# Patient Record
Sex: Female | Born: 1940 | Race: White | Hispanic: No | Smoking: Never smoker
Health system: Southern US, Community
[De-identification: ages and names within clinical notes are randomized; demographics above are authoritative.]

## PROBLEM LIST (undated history)

## (undated) DIAGNOSIS — M8580 Other specified disorders of bone density and structure, unspecified site: Secondary | ICD-10-CM

## (undated) DIAGNOSIS — R7303 Prediabetes: Secondary | ICD-10-CM

## (undated) DIAGNOSIS — E559 Vitamin D deficiency, unspecified: Secondary | ICD-10-CM

## (undated) DIAGNOSIS — K222 Esophageal obstruction: Secondary | ICD-10-CM

## (undated) DIAGNOSIS — T7840XA Allergy, unspecified, initial encounter: Secondary | ICD-10-CM

## (undated) DIAGNOSIS — I4891 Unspecified atrial fibrillation: Secondary | ICD-10-CM

## (undated) DIAGNOSIS — K219 Gastro-esophageal reflux disease without esophagitis: Secondary | ICD-10-CM

## (undated) DIAGNOSIS — I341 Nonrheumatic mitral (valve) prolapse: Secondary | ICD-10-CM

## (undated) HISTORY — DX: Nonrheumatic mitral (valve) prolapse: I34.1

## (undated) HISTORY — PX: TONSILLECTOMY AND ADENOIDECTOMY: SUR1326

## (undated) HISTORY — DX: Gastro-esophageal reflux disease without esophagitis: K21.9

## (undated) HISTORY — DX: Allergy, unspecified, initial encounter: T78.40XA

## (undated) HISTORY — DX: Vitamin D deficiency, unspecified: E55.9

## (undated) HISTORY — PX: ABDOMINAL HYSTERECTOMY: SUR658

## (undated) HISTORY — DX: Prediabetes: R73.03

## (undated) HISTORY — DX: Other specified disorders of bone density and structure, unspecified site: M85.80

## (undated) HISTORY — DX: Esophageal obstruction: K22.2

## (undated) HISTORY — DX: Unspecified atrial fibrillation: I48.91

---

## 1997-09-18 ENCOUNTER — Ambulatory Visit (HOSPITAL_COMMUNITY): Admission: RE | Admit: 1997-09-18 | Discharge: 1997-09-18 | Payer: Self-pay | Admitting: Gastroenterology

## 2006-04-05 ENCOUNTER — Inpatient Hospital Stay (HOSPITAL_COMMUNITY): Admission: AD | Admit: 2006-04-05 | Discharge: 2006-04-07 | Payer: Self-pay | Admitting: Internal Medicine

## 2006-04-06 ENCOUNTER — Encounter (INDEPENDENT_AMBULATORY_CARE_PROVIDER_SITE_OTHER): Payer: Self-pay | Admitting: Interventional Cardiology

## 2006-04-08 ENCOUNTER — Ambulatory Visit (HOSPITAL_COMMUNITY): Admission: RE | Admit: 2006-04-08 | Discharge: 2006-04-08 | Payer: Self-pay | Admitting: Internal Medicine

## 2007-06-05 ENCOUNTER — Ambulatory Visit (HOSPITAL_COMMUNITY): Admission: RE | Admit: 2007-06-05 | Discharge: 2007-06-05 | Payer: Self-pay | Admitting: Interventional Cardiology

## 2007-06-05 ENCOUNTER — Encounter (INDEPENDENT_AMBULATORY_CARE_PROVIDER_SITE_OTHER): Payer: Self-pay | Admitting: Interventional Cardiology

## 2007-10-21 ENCOUNTER — Emergency Department (HOSPITAL_COMMUNITY): Admission: EM | Admit: 2007-10-21 | Discharge: 2007-10-21 | Payer: Self-pay | Admitting: Emergency Medicine

## 2008-11-09 ENCOUNTER — Emergency Department (HOSPITAL_COMMUNITY): Admission: EM | Admit: 2008-11-09 | Discharge: 2008-11-09 | Payer: Self-pay | Admitting: Emergency Medicine

## 2010-04-23 LAB — PROTIME-INR: Prothrombin Time: 31.9 seconds — ABNORMAL HIGH (ref 11.6–15.2)

## 2010-06-02 NOTE — Op Note (Signed)
NAMELOYE, REININGER                ACCOUNT NO.:  [id number]   MEDICAL RECORD NO.:  [id number]          PATIENT TYPE:  AMB   LOCATION:  ENDO                         FACILITY:  [facility]   PHYSICIAN:  Corky Crafts, MDDATE OF BIRTH:  1940/01/23   DATE OF PROCEDURE:  DATE OF DISCHARGE:                               OPERATIVE REPORT   REFERRING:  Emeterio Reeve, MD   PROCEDURES PERFORMED:  DC cardioversion.   PROCEDURE NARRATIVE:  The risks and benefits of DC cardioversion were  explained to the patient and informed consent was obtained.  The  transesophageal echo showed no evidence of left atrial appendage  thrombus.  Defibrillation pads were placed on her anterior chest wall  and back and 120 joules biphasic synchronized shock was administered  with successful restoration of normal sinus rhythm.  Pentothal 125 mg IV  was used.  Dr. Ivin Booty per anesthesia was present.  The patient tolerated  the procedure well.   PLAN:  Continue Coumadin indefinitely.  We will have her take an extra  dose of Coumadin today as her INR was 2.1.  I will have it run a little  bit higher for the next few weeks.  I have her followup in the office in  the next 1-2 weeks.  She will also come in for routine INR checks.      Corky Crafts, MD  Electronically Signed     JSV/MEDQ  D:  06/05/2007  T:  06/06/2007  Job:  [id number]

## 2010-06-05 NOTE — H&P (Signed)
Marie [name], Marie [name]                ACCOUNT NO.:  [id number]   MEDICAL RECORD NO.:  [id number]          PATIENT TYPE:  INP   LOCATION:  4735                         FACILITY:  [facility]   PHYSICIAN:  Andres Shad. Rudean Curt, MD     DATE OF BIRTH:  Aug 19, 1940   DATE OF ADMISSION:  04/05/2006  DATE OF DISCHARGE:                              HISTORY & PHYSICAL   CHIEF COMPLAINT:  New onset atrial fibrillation.   HISTORY OF PRESENT ILLNESS:  Marie [name] is a 70 year old female with a  fairly significant past medical history. She was referred for admission  today because of a new diagnosis of atrial fibrillation with rapid  ventricular response. She was in her usual state of health until  approximately 2-3 weeks ago when she developed what she thought was a  urinary tract infection.  She was seen in the office and found to have a  fast, irregular heart beat at the time.  She was thought not to have a  urinary tract infection but rather a viral syndrome and returned to the  office today for followup and for physical exam.  Today in the office,  she was found to have a heart rate between the low 100s and the 110s,  and a 12-lead EKG revealed atrial fibrillation.   In retrospect, the patient thinks that she may have had episodes of  rapid irregular heart beat numerous times over the last few years.  She  does acknowledge symptoms of palpitations.  In the past she has also had  episodes of passing out several times a year since the mid 1980s.  All  of these episodes have been preceded by a sensation of blacking out for  feeling faint, especially during times of emotional stress.  She does  not recall any episodes preceded by a rapid heart beat or pounding heart  beat.  She states that in 1986 she did have one episode of passing out  that was associated with some weakness on one side of her body.  A CT  scan of the head done at that time was negative.   REVIEW OF SYSTEMS:  Otherwise negative.   MEDICATIONS:  None.   ALLERGIES:  SULFA.   PAST MEDICAL HISTORY:  1. Gastroesophageal reflux.  2  Status post esophageal dilatation.  1. Hysterectomy.   SOCIAL HISTORY:  No smoking, drugs, or alcohol.   FAMILY HISTORY:  Noncontributory.   PHYSICAL EXAMINATION:  VITAL SIGNS: Temperature 97.7, pulse 115,  respiratory rate 18, blood pressure 125/68, oxygen saturation 97% on  room air.  Weight 66 kg.  EKG: Atrial fibrillation with rapid  ventricular response.  GENERAL APPEARANCE:  The patient was alert, well appearing, in no  apparent distress.  HEENT:  Mucous membranes were moist.  Sclerae normal.  NECK:  No jugular venous distention.  LUNGS:  Clear to auscultation bilaterally.  CARDIOVASCULAR:  Irregular, rapid, no murmurs, rubs, or gallops.  ABDOMEN:  Normal bowel sounds.  Soft, nontender, nondistended.  No  organomegaly.  EXTREMITIES:  No evidence of embolic phenomena, no edema.  Warm, well  perfused.  ASSESSMENT AND PLAN:  New diagnosis of atrial fibrillation with rapid  ventricular response.   The first priority will  be to slow down the patient's heart rate . To  this end, I have ordered metoprolol 5 mg IV x1.  If her heart rate fails  to respond to this, I will put her on a longer acting beta blocker to  slow her heart rate.  It is very likely that she will need a chronic  dose of beta blocker to keep her rate controlled.   For further evaluation of her atrial fibrillation, I have ordered a 2-D  echocardiogram to assess wall motion abnormalities or other structural  abnormalities of the heart.  I have also ordered a TSH, a fasting lipid  panel, and a hemoglobin A1c.   Because of her episodes of passing out, I have ordered a CT scan of the  head without contrast that will hopefully show if there is any evidence  of previous thromboembolic disease.   Additionally, I will begin the patient on Coumadin with a goal INR of 2-  3.  The patient will be kept on telemetry  monitoring, and I will order a  set of serial cardiac enzymes to ensure there is no evidence of  myocardial ischemia.      Andres Shad. Rudean Curt, MD  Electronically Signed     PML/MEDQ  D:  04/05/2006  T:  04/05/2006  Job:  [id number]   cc:   Emeterio Reeve, MD

## 2010-10-19 LAB — POCT CARDIAC MARKERS
CKMB, poc: 1.3
CKMB, poc: 2.3
Myoglobin, poc: 115
Myoglobin, poc: 83.2
Troponin i, poc: <0.05
Troponin i, poc: <0.05

## 2010-10-19 LAB — POCT I-STAT, CHEM 8
BUN: 6
Calcium, Ion: 1.17
Chloride: 105
Creatinine, Ser: 0.9
Glucose, Bld: 116 — ABNORMAL HIGH
HCT: 38
Hemoglobin: 12.9
Potassium: 4
Sodium: 141
TCO2: 26

## 2010-10-19 LAB — URINALYSIS, ROUTINE W REFLEX MICROSCOPIC
Nitrite: NEGATIVE
Urobilinogen, UA: 0.2

## 2010-10-19 LAB — PROTIME-INR
INR: 1.7 — ABNORMAL HIGH
Prothrombin Time: 21.1 — ABNORMAL HIGH

## 2010-10-19 LAB — URINE MICROSCOPIC-ADD ON

## 2010-10-19 LAB — APTT: aPTT: 50 — ABNORMAL HIGH

## 2011-04-29 ENCOUNTER — Encounter: Payer: Self-pay | Admitting: Gastroenterology

## 2011-05-20 ENCOUNTER — Ambulatory Visit (INDEPENDENT_AMBULATORY_CARE_PROVIDER_SITE_OTHER): Payer: Medicare Other | Admitting: Gastroenterology

## 2011-05-20 ENCOUNTER — Encounter: Payer: Self-pay | Admitting: Gastroenterology

## 2011-05-20 VITALS — BP 100/70 | HR 58 | Ht 65.0 in | Wt 159.0 lb

## 2011-05-20 DIAGNOSIS — R1310 Dysphagia, unspecified: Secondary | ICD-10-CM

## 2011-05-20 DIAGNOSIS — I4891 Unspecified atrial fibrillation: Secondary | ICD-10-CM

## 2011-05-20 DIAGNOSIS — I7300 Raynaud's syndrome without gangrene: Secondary | ICD-10-CM

## 2011-05-20 DIAGNOSIS — R1314 Dysphagia, pharyngoesophageal phase: Secondary | ICD-10-CM

## 2011-05-20 DIAGNOSIS — K625 Hemorrhage of anus and rectum: Secondary | ICD-10-CM

## 2011-05-20 DIAGNOSIS — K219 Gastro-esophageal reflux disease without esophagitis: Secondary | ICD-10-CM

## 2011-05-20 DIAGNOSIS — D689 Coagulation defect, unspecified: Secondary | ICD-10-CM

## 2011-05-20 MED ORDER — PEG-KCL-NACL-NASULF-NA ASC-C 100 G PO SOLR: 1.0000 | Freq: Once | ORAL | Status: DC

## 2011-05-20 NOTE — Patient Instructions (Addendum)
You have been scheduled for an endoscopy and colonoscopy with propofol. Please follow the written instructions given to you at your visit today. Please pick up your prep at the pharmacy within the next 1-3 days. You will be contaced by our office prior to your procedure for directions on holding your Coumadin/Warfarin.  If you do not hear from our office 1 week prior to your scheduled procedure, please call [phone] to discuss. We have given you samples of the following medication to take: Aciphex every night before bedtime. CC: Dr Mila Palmer, Dr Eldridge Dace

## 2011-05-20 NOTE — Progress Notes (Signed)
History of Present Illness:  This is a very nice 71 year old Caucasian female with a history of atrial fibrillation and possible previous TIAs. She is on Coumadin 5 mg a day and is followed by [facility]. She has Raynaud's phenomenon with a history of chronic acid reflux and previous endoscopic dilation of esophageal strictures. I cannot find her endoscopic exams for review. She currently has thick expectorated phlegm each morning with coughing and possible regurgitation, but denies true reflux. She does have intermittent solid food dysphagia. The patient denies bowel irregularity, melena, but does occasionally have rectal bleeding with straining. She relates that she had colonoscopy over 10 years ago. Appetite is good her weight is stable. She denies any current cardiovascular or pulmonary complaints. Family history is unremarkable in terms of any colon cancer.  I have reviewed this patient's present history, medical and surgical past history, allergies and medications.     ROS: The remainder of the 10 point ROS is negative.. Raynaud's phenomenon but no other symptoms of collagen vascular disease. She is status post hysterectomy. Atrial fibrillation began in year 2008. Currently the patient denies any cardiovascular or neurovascular problems.     Physical Exam: Blood pressure 100/70, pulse 58 and irregular, and weight 159 pounds with BMI of 26.46. General well developed well nourished patient in no acute distress, appearing her stated age Eyes PERRLA, no icterus, fundoscopic exam per opthamologist Skin no lesions noted... very cold digits with visible cyanosis. I cannot appreciate edema or swelling of her joints. Neck supple, no adenopathy, no thyroid enlargement, no tenderness Chest clear to percussion and auscultation Heart no significant murmurs, gallops or rubs noted, irregular rhythm but rate controlled. Abdomen no hepatosplenomegaly masses or tenderness, BS normal.  Rectal inspection  normal no fissures, or fistulae noted. Prominent perianal skin tags noted.  No masses or tenderness on digital exam. Stool guaiac negative. Extremities no acute joint lesions, edema, phlebitis or evidence of cellulitis. Neurologic patient oriented x 3, cranial nerves intact, no focal neurologic deficits noted. Psychological mental status normal and normal affect.  Assessment and plan: 71 year old female who has not had colonoscopy in over 10 years. This has been scheduled at her convenience with holding Coumadin 5 days before this procedure unless otherwise advised by cardiology. 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 Because of her dysphagia, I do think repeat endoscopy also is in order at that time a propofol sedation. The patient relates a recent blood work to show no evidence of anemia or iron deficiency. She appears very stable from a cardiovascular and neurovascular standpoint.   Please copy her primary care physician, referring physician, and pertinent subspecialists. Dr. Mila Palmer and [facility].  Encounter Diagnosis  Name Primary?  . Other and unspecified coagulation defects Yes

## 2011-05-24 ENCOUNTER — Telehealth: Payer: Self-pay | Admitting: *Deleted

## 2011-05-24 NOTE — Telephone Encounter (Signed)
I have advised patient that per Dr Ali Lowe note to our office, she may hold Coumadin 5 days prior to her endoscopy and colonoscopy test with Dr Jarold Motto. Patient verbalizes understanding.

## 2011-06-07 ENCOUNTER — Ambulatory Visit (AMBULATORY_SURGERY_CENTER): Payer: Medicare Other | Admitting: Gastroenterology

## 2011-06-07 ENCOUNTER — Encounter: Payer: Self-pay | Admitting: Gastroenterology

## 2011-06-07 VITALS — BP 120/66 | HR 88 | Temp 98.4°F | Resp 17 | Ht 65.0 in | Wt 159.0 lb

## 2011-06-07 DIAGNOSIS — K219 Gastro-esophageal reflux disease without esophagitis: Secondary | ICD-10-CM

## 2011-06-07 DIAGNOSIS — K5730 Diverticulosis of large intestine without perforation or abscess without bleeding: Secondary | ICD-10-CM

## 2011-06-07 DIAGNOSIS — R1310 Dysphagia, unspecified: Secondary | ICD-10-CM

## 2011-06-07 DIAGNOSIS — R1314 Dysphagia, pharyngoesophageal phase: Secondary | ICD-10-CM

## 2011-06-07 DIAGNOSIS — D689 Coagulation defect, unspecified: Secondary | ICD-10-CM

## 2011-06-07 DIAGNOSIS — Z1211 Encounter for screening for malignant neoplasm of colon: Secondary | ICD-10-CM

## 2011-06-07 MED ORDER — SODIUM CHLORIDE 0.9 % IV SOLN: 500.0000 mL | INTRAVENOUS | Status: DC

## 2011-06-07 NOTE — Progress Notes (Signed)
Propofol per [facility] crna. See scanned intra procedure report. ewm 

## 2011-06-07 NOTE — Op Note (Signed)
[facility] [address] [place], [place]  [address]  COLONOSCOPY PROCEDURE REPORT  PATIENT:  [name], Marie  MR#:  [id number] BIRTHDATE:  1940/04/22, 70 yrs. old  GENDER:  female ENDOSCOPIST:  Vania Rea. Jarold Motto, MD, [facility] REF. BY: PROCEDURE DATE:  06/07/2011 PROCEDURE:  Average-risk screening colonoscopy G0121 ASA CLASS:  Class III INDICATIONS:  Routine Risk Screening MEDICATIONS:   propofol (Diprivan) 150 mg IV  DESCRIPTION OF PROCEDURE:   After the risks and benefits and of the procedure were explained, informed consent was obtained. Digital rectal exam was performed and revealed no abnormalities. The LB CF-[id number] endoscope was introduced through the anus and advanced to the cecum, which was identified by both the appendix and ileocecal valve.  The quality of the prep was excellent, using MoviPrep.  The instrument was then slowly withdrawn as the colon was fully examined. <<PROCEDUREIMAGES>>  FINDINGS:  There were mild diverticular changes in left colon. diverticulosis was found.  No polyps or cancers were seen.  This was otherwise a normal examination of the colon.   Retroflexed views in the rectum revealed no abnormalities.    The scope was then withdrawn from the patient and the procedure completed.  COMPLICATIONS:  None ENDOSCOPIC IMPRESSION: 1) Diverticulosis,mild,left sided diverticulosis 2) No polyps or cancers 3) Otherwise normal examination RECOMMENDATIONS: 1) Repeat Colonscopy in 10 years. 2) High fiber diet  REPEAT EXAM:  No  ______________________________ Vania Rea. Jarold Motto, MD, [facility]  CC:  Mila Palmer, MD  n. Rosalie Doctor:   Vania Rea. Tyren Dugar at 06/07/2011 02:23 PM  Fernande Bras, [phone]

## 2011-06-07 NOTE — Op Note (Signed)
[facility] [address] [place], [place]  [address]  ENDOSCOPY PROCEDURE REPORT  PATIENT:  Marie [name], Marie [name]  MR#:  [id number] BIRTHDATE:  [birth year], 70 yrs. old  GENDER:  female  ENDOSCOPIST:  Vania Rea. Jarold Motto, MD, [facility] Referred by:  PROCEDURE DATE:  06/07/2011 PROCEDURE:  Elease Hashimoto Dilation of Esophagus, EGD, diagnostic 43235 ASA CLASS:  Class III INDICATIONS:  gerd and dysphagia  MEDICATIONS:   There was residual sedation effect present from prior procedure., propofol (Diprivan) 100 mg IV TOPICAL ANESTHETIC:  DESCRIPTION OF PROCEDURE:   After the risks and benefits of the procedure were explained, informed consent was obtained.  The [facility] GIF-H180 [id number] endoscope was introduced through the mouth and advanced to the second portion of the duodenum.  The instrument was slowly withdrawn as the mucosa was fully examined. <<PROCEDUREIMAGES>>  A hiatal hernia was found. large hh noted.  A stricture was found. ESOPHAGEAL STRICTURE DILATED #40F MALONEY DILATOR,NO HEME OR PAIN. There were multiple polyps identified. FUNDAL HYPERPLASTIC POLYPS NOTED.  Normal duodenal folds were noted.    HH AND SOME BLEEDING FROM SCOPE PASSAGE.SEE PICTURES,  The scope was then withdrawn from the patient and the procedure completed.  COMPLICATIONS:  None  ENDOSCOPIC IMPRESSION: 1) Hiatal hernia 2) Stricture 3) Polyps, multiple 4) Normal duodenal folds CHRNIC GERD,ESOPHAGEAL MOTILITY DISORDER,AND ESOHAGEAL STRICTURE DILATED. RECOMMENDATIONS: 1) Clear liquids until, then soft foods rest iof day. Resume prior diet tomorrow. RESUME PPI AND COUMADIN.CHRONIC REFLUX REGIME REVIEWED.  ______________________________ Vania Rea. Jarold Motto, MD, [facility]  CC:  Mila Palmer, MD  n. Rosalie Doctor:   Vania Rea. Acelin Ferdig at 06/07/2011 02:34 PM  Fernande Bras, [id number]

## 2011-06-07 NOTE — Patient Instructions (Signed)
Discharge instructions given with verbal understanding. Handouts on hiatal hernia,stricture,gerd,dilatation diet, and diverticulosis given. Resume previous medications.YOU HAD AN ENDOSCOPIC PROCEDURE TODAY AT THE [facility]: Refer to the procedure report that was given to you for any specific questions about what was found during the examination.  If the procedure report does not answer your questions, please call your gastroenterologist to clarify.  If you requested that your care partner not be given the details of your procedure findings, then the procedure report has been included in a sealed envelope for you to review at your convenience later.  YOU SHOULD EXPECT: Some feelings of bloating in the abdomen. Passage of more gas than usual.  Walking can help get rid of the air that was put into your GI tract during the procedure and reduce the bloating. If you had a lower endoscopy (such as a colonoscopy or flexible sigmoidoscopy) you may notice spotting of blood in your stool or on the toilet paper. If you underwent a bowel prep for your procedure, then you may not have a normal bowel movement for a few days.  DIET: Your first meal following the procedure should be a light meal and then it is ok to progress to your normal diet.  A half-sandwich or bowl of soup is an example of a good first meal.  Heavy or fried foods are harder to digest and may make you feel nauseous or bloated.  Likewise meals heavy in dairy and vegetables can cause extra gas to form and this can also increase the bloating.  Drink plenty of fluids but you should avoid alcoholic beverages for 24 hours.  ACTIVITY: Your care partner should take you home directly after the procedure.  You should plan to take it easy, moving slowly for the rest of the day.  You can resume normal activity the day after the procedure however you should NOT DRIVE or use heavy machinery for 24 hours (because of the sedation medicines used during the  test).    SYMPTOMS TO REPORT IMMEDIATELY: A gastroenterologist can be reached at any hour.  During normal business hours, 8:30 AM to 5:00 PM Monday through Friday, call [phone].  After hours and on weekends, please call the GI answering service at [phone] who will take a message and have the physician on call contact you.   Following lower endoscopy (colonoscopy or flexible sigmoidoscopy):  Excessive amounts of blood in the stool  Significant tenderness or worsening of abdominal pains  Swelling of the abdomen that is new, acute  Fever of 100F or higher  Following upper endoscopy (EGD)  Vomiting of blood or coffee ground material  New chest pain or pain under the shoulder blades  Painful or persistently difficult swallowing  New shortness of breath  Fever of 100F or higher  Black, tarry-looking stools  FOLLOW UP: If any biopsies were taken you will be contacted by phone or by letter within the next 1-3 weeks.  Call your gastroenterologist if you have not heard about the biopsies in 3 weeks.  Our staff will call the home number listed on your records the next business day following your procedure to check on you and address any questions or concerns that you may have at that time regarding the information given to you following your procedure. This is a courtesy call and so if there is no answer at the home number and we have not heard from you through the emergency physician on call, we will assume that  you have returned to your regular daily activities without incident.  SIGNATURES/CONFIDENTIALITY: You and/or your care partner have signed paperwork which will be entered into your electronic medical record.  These signatures attest to the fact that that the information above on your After Visit Summary has been reviewed and is understood.  Full responsibility of the confidentiality of this discharge information lies with you and/or your care-partner.

## 2011-06-07 NOTE — Progress Notes (Signed)
Patient did not experience any of the following events: a burn prior to discharge; a fall within the facility; wrong site/side/patient/procedure/implant event; or a hospital transfer or hospital admission upon discharge from the facility. (G8907) Patient did not have preoperative order for IV antibiotic SSI prophylaxis. (G8918)  

## 2011-06-08 ENCOUNTER — Telehealth: Payer: Self-pay

## 2011-06-08 NOTE — Telephone Encounter (Signed)
  Follow up Call-  Call back number 06/07/2011  Post procedure Call Back phone  # [phone]  Permission to leave phone message Yes     Patient questions:  Do you have a fever, pain , or abdominal swelling? no Pain Score  0 *  Have you tolerated food without any problems? yes  Have you been able to return to your normal activities? yes  Do you have any questions about your discharge instructions: Diet   no Medications  no Follow up visit  no  Do you have questions or concerns about your Care? no  Actions: * If pain score is 4 or above: No action needed, pain <4.

## 2012-12-25 ENCOUNTER — Ambulatory Visit
Admission: RE | Admit: 2012-12-25 | Discharge: 2012-12-25 | Disposition: A | Discharge status: Home / Self Care | Payer: Medicare Other | Source: Ambulatory Visit | Attending: Family Medicine | Admitting: Family Medicine

## 2012-12-25 ENCOUNTER — Other Ambulatory Visit: Payer: Self-pay | Admitting: Family Medicine

## 2012-12-25 DIAGNOSIS — M545 Low back pain: Secondary | ICD-10-CM

## 2012-12-25 DIAGNOSIS — M25551 Pain in right hip: Secondary | ICD-10-CM

## 2013-04-03 ENCOUNTER — Ambulatory Visit: Payer: Medicare Other | Admitting: Interventional Cardiology

## 2013-04-13 ENCOUNTER — Other Ambulatory Visit: Payer: Self-pay | Admitting: Cardiology

## 2013-04-13 MED ORDER — ATENOLOL 25 MG PO TABS: 25.0000 mg | ORAL_TABLET | Freq: Every day | ORAL | Status: DC

## 2013-04-13 NOTE — Telephone Encounter (Signed)
Spoke with pharmacy and he spoke with pt who told him she was only taking atenolol 25 mg qd not bid. Refilled qd since that is what pt has been taking. FYI to Dr. Eldridge DaceVaranasi.

## 2013-04-13 NOTE — Addendum Note (Signed)
Addended byOrlene Plum: Adryen Cookson H on: 04/13/2013 11:56 AM   Modules accepted: Orders

## 2013-04-23 ENCOUNTER — Telehealth: Payer: Self-pay | Admitting: Interventional Cardiology

## 2013-04-23 MED ORDER — ATENOLOL 25 MG PO TABS: ORAL_TABLET | ORAL | Status: DC

## 2013-04-23 NOTE — Telephone Encounter (Signed)
New message     Question about dosage on atenolol.  She takes 2--- 25mg  pills daily but the new presc said take 1 --25mg  pill.

## 2013-04-23 NOTE — Telephone Encounter (Signed)
Spoke with pt and she thinks that she told the pharmacy 1 tablet daily when she was really taking 2 tablets all along. Rx corrected and sent into pharmacy

## 2013-06-12 ENCOUNTER — Ambulatory Visit (INDEPENDENT_AMBULATORY_CARE_PROVIDER_SITE_OTHER): Payer: Medicare Other | Admitting: Interventional Cardiology

## 2013-06-12 ENCOUNTER — Encounter: Payer: Self-pay | Admitting: Cardiology

## 2013-06-12 ENCOUNTER — Encounter: Payer: Self-pay | Admitting: Interventional Cardiology

## 2013-06-12 VITALS — BP 110/90 | HR 81 | Ht 65.0 in | Wt 155.0 lb

## 2013-06-12 DIAGNOSIS — I4891 Unspecified atrial fibrillation: Secondary | ICD-10-CM

## 2013-06-12 DIAGNOSIS — E559 Vitamin D deficiency, unspecified: Secondary | ICD-10-CM | POA: Insufficient documentation

## 2013-06-12 NOTE — Patient Instructions (Signed)
Your physician recommends that you continue on your current medications as directed. Please refer to the Current Medication list given to you today.  Your physician wants you to follow-up in: 1 year with Dr. Varanasi. You will receive a reminder letter in the mail two months in advance. If you don't receive a letter, please call our office to schedule the follow-up appointment.  

## 2013-06-12 NOTE — Progress Notes (Signed)
[name], female   DOB: [birth year], 73 y.o.   MRN: [id number]    [address] [place], [place]  [address] Phone: [phone] Fax:  [phone]  Date:  06/12/2013   ID:  Marie [name], Marie [name] Oct 16, 1940, MRN [id number]  PCP:  Emeterio Reeve, MD      History of Present Illness: Marie [name] is a 73 y.o. female with atrial fibrillation. Feels ok with exercise. Has not been exercising much lately. Atrial Fibrillation F/U:  Denies : Chest pain.  Dizziness.  Leg edema.  Orthopnea.  Palpitations.  Shortness of breath.  Syncope.  She has neck and shoulder pain. She walks some for exercise. She does some exercises at home as well including stretching.    Wt Readings from Last 3 Encounters:  06/12/13 155 lb (70.308 kg)  06/07/11 159 lb (72.122 kg)  05/20/11 159 lb (72.122 kg)     Past Medical History  Diagnosis Date  . Atrial fibrillation   . Esophageal stricture   . Allergy   . GERD (gastroesophageal reflux disease)   . Vitamin D deficiency   . MVP (mitral valve prolapse)   . Pre-diabetes   . Osteopenia     Current Outpatient Prescriptions  Medication Sig Dispense Refill  . atenolol (TENORMIN) 25 MG tablet 2 tablets once a day..  (pt has been on 2 tablets all along).  60 tablet  1  . Multiple Vitamins-Minerals (CENTRUM SILVER PO) Take by mouth.      . warfarin (COUMADIN) 5 MG tablet Take 5 mg by mouth daily.       No current facility-administered medications for this visit.    Allergies:    Allergies  Allergen Reactions  . Adacel [Diphth-Acell Pertussis-Tetanus]   . Sulfa Drugs Cross Reactors   . Tetanus Toxoids   . Vicodin [Hydrocodone-Acetaminophen]     Social History:  The patient  reports that she has never smoked. She has never used smokeless tobacco. She reports that she does not drink alcohol or use illicit drugs.   Family History:  The patient's family history includes Breast cancer in an other family member; Heart  disease in an other family member; Heart failure in her father.   ROS:  Please see the history of present illness.  No nausea, vomiting.  No fevers, chills.  No focal weakness.  No dysuria. Right leg pain.   All other systems reviewed and negative.   PHYSICAL EXAM: VS:  BP 110/90  Pulse 81  Ht 5\' 5"  (1.651 m)  Wt 155 lb (70.308 kg)  BMI 25.79 kg/m2 Well nourished, well developed, in no acute distress HEENT: normal Neck: no JVD, no carotid bruits Cardiac:  normal S1, S2; irregularly irregular Lungs:  clear to auscultation bilaterally, no wheezing, rhonchi or rales Abd: soft, nontender, no hepatomegaly Ext: no edema Skin: warm and dry Neuro:   no focal abnormalities noted  EKG:  AFib, rate controlled     ASSESSMENT AND PLAN:  Atrial fibrillation  Continue Atenolol Tablet, 25, TAKE TWO TABLETS BY MOUTH EVERY DAY Continue Warfarin Sodium Tablet, 5MG , TAKE ONE TABLET BY MOUTH ONCE A DAY IMAGING: EKG    Harward,Amy 04/04/2012 01:50:59 PM > Jilian West,JAY 04/04/2012 02:35:31 PM > AFib, rate controlled. no ST segment changes   Notes: We discussed the newer anticoagulants. Did not like Xarelto for stroke prevention.Rate controlled. She prefers Coumadin, managed by Dr. Paulino Rily.   Preventive Medicine  Adult topics discussed:  Diet: healthy diet.  Exercise: at least 30 minutes of aerobic exercise, 5 days a week.      Signed, Fredric MareJay S. Karas Pickerill, MD, [facility] 06/12/2013 11:17 AM

## 2014-06-13 ENCOUNTER — Ambulatory Visit: Payer: Self-pay | Admitting: Interventional Cardiology

## 2014-06-21 ENCOUNTER — Encounter: Payer: Self-pay | Admitting: Interventional Cardiology

## 2014-06-21 ENCOUNTER — Ambulatory Visit (INDEPENDENT_AMBULATORY_CARE_PROVIDER_SITE_OTHER): Payer: Medicare Other | Admitting: Interventional Cardiology

## 2014-06-21 VITALS — BP 124/70 | HR 69 | Ht 66.0 in | Wt 149.1 lb

## 2014-06-21 DIAGNOSIS — I4891 Unspecified atrial fibrillation: Secondary | ICD-10-CM

## 2014-06-21 DIAGNOSIS — I359 Nonrheumatic aortic valve disorder, unspecified: Secondary | ICD-10-CM

## 2014-06-21 NOTE — Patient Instructions (Signed)
**Note De-identified  Obfuscation** Medication Instructions:  Same-No change  Labwork: None  Testing/Procedures: None  Follow-Up: Your physician wants you to follow-up in: 1 year. You will receive a reminder letter in the mail two months in advance. If you don't receive a letter, please call our office to schedule the follow-up appointment.      

## 2014-06-21 NOTE — Progress Notes (Signed)
[name], female   DOB: Jun 08, 1940, 74 y.o.   MRN: [id number]     Cardiology Office Note   Date:  06/21/2014   ID:  Marie [name] 1940/05/26, MRN [id number]  PCP:  Emeterio Reeve, MD    No chief complaint on file.    Wt Readings from Last 3 Encounters:  06/21/14 149 lb 1.9 oz (67.64 kg)  06/12/13 155 lb (70.308 kg)  06/07/11 159 lb (72.122 kg)       History of Present Illness: Marie [name] is a 74 y.o. female  with atrial fibrillation. Feels ok with exercise, but Has not been exercising much lately. Atrial Fibrillation F/U:  Denies : Chest pain.  Dizziness.  Leg edema.  Orthopnea.  Palpitations.  Shortness of breath.  Syncope.   Sprained her right ankle a few weeks ago.  Wants to start walking with her granddaughter.  No bleeding issues with COumadin.  Dose has been fairly stable.  She does bruise easily.    Past Medical History  Diagnosis Date  . Atrial fibrillation   . Esophageal stricture   . Allergy   . GERD (gastroesophageal reflux disease)   . Vitamin D deficiency   . MVP (mitral valve prolapse)   . Pre-diabetes   . Osteopenia     Past Surgical History  Procedure Laterality Date  . Abdominal hysterectomy      1984  . Tonsillectomy and adenoidectomy       Current Outpatient Prescriptions  Medication Sig Dispense Refill  . atenolol (TENORMIN) 25 MG tablet Take 50 mg by mouth daily.    . cholecalciferol (VITAMIN D) 1000 UNITS tablet Take 1,000 Units by mouth daily.    . Multiple Vitamins-Minerals (CENTRUM SILVER PO) Take 1 tablet by mouth daily.     Marland Kitchen warfarin (COUMADIN) 5 MG tablet Take 5 mg by mouth daily.     No current facility-administered medications for this visit.    Allergies:   Sulfa drugs cross reactors; Adacel; Tetanus toxoids; and Vicodin    Social History:  The patient  reports that she has never smoked. She has never used smokeless tobacco. She reports that she does not drink alcohol or use illicit drugs.     Family History:  The patient's *family history includes Breast cancer in her mother; Healthy in her brother and sister; Heart disease in her maternal aunt; Heart failure in her father; Lymphoma in her mother.    ROS:  Please see the history of present illness.   Otherwise, review of systems are positive for ankle pain- improving.   All other systems are reviewed and negative.    PHYSICAL EXAM: VS:  BP 124/70 mmHg  Pulse 69  Ht  (1.676 m)  Wt 149 lb 1.9 oz (67.64 kg)  BMI 24.08 kg/m2 , BMI Body mass index is 24.08 kg/(m^2). GEN: Well nourished, well developed, in no acute distress HEENT: normal Neck: no JVD, carotid bruits, or masses Cardiac: RRR; 2/6 systolic murmur, rubs, or gallops,no edema  Respiratory:  clear to auscultation bilaterally, normal work of breathing GI: soft, nontender, nondistended, + BS MS: no deformity or atrophy Skin: warm and dry, no rash Neuro:  Strength and sensation are intact Psych: euthymic mood, full affect   EKG:   The ekg ordered today demonstrates AFib, rate controlled   Recent Labs: No results found for requested labs within last 365 days.   Lipid Panel No results found for: CHOL, TRIG, HDL, CHOLHDL, VLDL, LDLCALC,  LDLDIRECT   Other studies Reviewed: Additional studies/ records that were reviewed today with results demonstrating: 2009 echo, mild aortic valve calcification.   ASSESSMENT AND PLAN:  Atrial fibrillation:  Continue Atenolol Tablet, 25, TAKE TWO TABLETS BY MOUTH EVERY DAY Continue Warfarin Sodium Tablet, 5MG , TAKE ONE TABLET BY MOUTH ONCE A DAY       Notes: We discussed the newer anticoagulants. Did not like Xarelto for stroke prevention , she felt it made her legs weak.Rate controlled. She prefers Coumadin, managed by Dr. Paulino RilyWolters.She does not like not being able to eat broccoli, but does not want to try to change anticoagulation.           Aortic calcification:  Murmur is present.  May be mild aortic stenosis.   No sx.  WIll not check echo unless she has symptoms.   Current medicines are reviewed at length with the patient today.  The patient concerns regarding her medicines were addressed.  The following changes have been made:  No change  Labs/ tests ordered today include:  No orders of the defined types were placed in this encounter.    Recommend 150 minutes/week of aerobic exercise Low fat, low carb, high fiber diet recommended  Disposition:   FU in 1 year   Delorise JacksonSigned, Kaysha Parsell S., MD  06/21/2014 11:06 AM    [facility] [address], [place], [place]  [address] Phone: [phone]) [phone]; Fax: [phone]

## 2015-07-04 ENCOUNTER — Ambulatory Visit (INDEPENDENT_AMBULATORY_CARE_PROVIDER_SITE_OTHER): Payer: Medicare Other | Admitting: Interventional Cardiology

## 2015-07-04 ENCOUNTER — Encounter: Payer: Self-pay | Admitting: Interventional Cardiology

## 2015-07-04 VITALS — BP 120/80 | HR 77 | Ht 66.0 in | Wt 154.0 lb

## 2015-07-04 DIAGNOSIS — I359 Nonrheumatic aortic valve disorder, unspecified: Secondary | ICD-10-CM

## 2015-07-04 DIAGNOSIS — I482 Chronic atrial fibrillation, unspecified: Secondary | ICD-10-CM

## 2015-07-04 NOTE — Progress Notes (Signed)
[name], female   DOB: February 13, 1940, 75 y.o.   MRN: [id number]     Cardiology Office Note   Date:  07/04/2015   ID:  Marie [name] 07/31/1940, MRN [id number]  PCP:  Emeterio Reeve, MD    No chief complaint on file.    Wt Readings from Last 3 Encounters:  07/04/15 154 lb (69.854 kg)  06/21/14 149 lb 1.9 oz (67.64 kg)  06/12/13 155 lb (70.308 kg)       History of Present Illness: Marie [name] is a 75 y.o. female  with atrial fibrillation. Feels ok with exercise, but Has not been exercising much lately.  Does not have anybody that she can walk with.  Atrial Fibrillation F/U:  Denies : Chest pain.  Dizziness.  Leg edema.  Orthopnea.  Palpitations.  Shortness of breath.  Syncope.  She does report a persistent bruise on her right foot.  Not walking regularly.  No bleeding issues with COumadin.  Dose has been fairly stable.  She does bruise easily.  Found that her brother and sister, both older, also have AFib.    Past Medical History  Diagnosis Date  . Atrial fibrillation   . Esophageal stricture   . Allergy   . GERD (gastroesophageal reflux disease)   . Vitamin D deficiency   . MVP (mitral valve prolapse)   . Pre-diabetes   . Osteopenia     Past Surgical History  Procedure Laterality Date  . Abdominal hysterectomy      1984  . Tonsillectomy and adenoidectomy       Current Outpatient Prescriptions  Medication Sig Dispense Refill  . atenolol (TENORMIN) 25 MG tablet Take 50 mg by mouth daily.    . cholecalciferol (VITAMIN D) 1000 UNITS tablet Take 1,000 Units by mouth daily.    . Multiple Vitamins-Minerals (CENTRUM SILVER PO) Take 1 tablet by mouth daily.     Marland Kitchen warfarin (COUMADIN) 5 MG tablet Take 5 mg by mouth daily.     No current facility-administered medications for this visit.    Allergies:   Sulfa drugs cross reactors; Adacel; Tetanus toxoids; and Vicodin    Social History:  The patient  reports that she has never smoked.  She has never used smokeless tobacco. She reports that she does not drink alcohol or use illicit drugs.   Family History:  The patient's *family history includes Atrial fibrillation in her brother and sister; Breast cancer in her mother; Healthy in her brother and sister; Heart disease in her maternal aunt; Heart failure in her father; Lymphoma in her mother. There is no history of Heart attack.    ROS:  Please see the history of present illness.   Otherwise, review of systems are positive for ankle pain- improving.   All other systems are reviewed and negative.    PHYSICAL EXAM: VS:  BP 120/80 mmHg  Pulse 77  Ht  (1.676 m)  Wt 154 lb (69.854 kg)  BMI 24.87 kg/m2 , BMI Body mass index is 24.87 kg/(m^2). GEN: Well nourished, well developed, in no acute distress HEENT: normal Neck: no JVD, carotid bruits, or masses Cardiac: irregularly irregular; 2/6 systolic murmur, rubs, or gallops,no edema  Respiratory:  clear to auscultation bilaterally, normal work of breathing GI: soft, nontender, nondistended, + BS MS: no deformity or atrophy Skin: warm and dry, no rash, small area of bruising on the right side of her right heel Neuro:  Strength and sensation are intact Psych: euthymic  mood, full affect   EKG:   The ekg ordered today demonstrates AFib, rate controlled   Recent Labs: No results found for requested labs within last 365 days.   Lipid Panel No results found for: CHOL, TRIG, HDL, CHOLHDL, VLDL, LDLCALC, LDLDIRECT   Other studies Reviewed: Additional studies/ records that were reviewed today with results demonstrating: 2009 echo, mild aortic valve calcification.   ASSESSMENT AND PLAN:  Atrial fibrillation:  Continue Atenolol Tablet, 25, TAKE TWO TABLETS BY MOUTH EVERY DAY Continue Warfarin Sodium Tablet, 5MG , TAKE ONE TABLET BY MOUTH ONCE A DAY       Notes: We discussed the newer anticoagulants. Did not like Xarelto for stroke prevention when she tried it in the  past , she felt it made her legs weak.  She could not walk. Rate controlled.  Explained why she needs 50 mg of atenolol for rate control.  She prefers Coumadin, managed by Dr. Paulino RilyWolters.She does not like not being able to eat broccoli, but does not want to try to change anticoagulation.  She does not want to try Eliquis.         Aortic calcification:  Murmur is present.  May be mild aortic stenosis.  No sx.  WIll not check echo unless she has symptoms.   Current medicines are reviewed at length with the patient today.  The patient concerns regarding her medicines were addressed.  The following changes have been made:  No change  Labs/ tests ordered today include:   Orders Placed This Encounter  Procedures  . EKG 12-Lead    Recommend 150 minutes/week of aerobic exercise Low fat, low carb, high fiber diet recommended  Disposition:   FU in 1 year   Signed, Lance MussJayadeep Audra Kagel, MD  07/04/2015 12:36 PM    [facility] [address], [place], [place]  [address] Phone: [phone](336) [phone]; Fax: [phone]

## 2015-07-04 NOTE — Patient Instructions (Signed)

## 2016-07-05 ENCOUNTER — Ambulatory Visit (INDEPENDENT_AMBULATORY_CARE_PROVIDER_SITE_OTHER): Payer: Medicare Other | Admitting: Podiatry

## 2016-07-05 ENCOUNTER — Ambulatory Visit: Payer: Medicare Other

## 2016-07-05 ENCOUNTER — Ambulatory Visit (INDEPENDENT_AMBULATORY_CARE_PROVIDER_SITE_OTHER): Payer: Medicare Other

## 2016-07-05 DIAGNOSIS — M722 Plantar fascial fibromatosis: Secondary | ICD-10-CM | POA: Diagnosis not present

## 2016-07-05 DIAGNOSIS — R52 Pain, unspecified: Secondary | ICD-10-CM

## 2016-07-05 DIAGNOSIS — M79672 Pain in left foot: Secondary | ICD-10-CM | POA: Diagnosis not present

## 2016-07-05 NOTE — Patient Instructions (Signed)

## 2016-07-08 DIAGNOSIS — M79672 Pain in left foot: Secondary | ICD-10-CM | POA: Insufficient documentation

## 2016-07-08 NOTE — Progress Notes (Signed)
Subjective:    [name], female   DOB: 76 y.o.   MRN: [id number]   HPI 76 year old female presents the office if returns the left foot injury. She states that in 2017 she fell and she heard a pop. She is unsure where she felt this had her foot however. She states that this injury happened after she was seen for some time she stood up in her feet were sleep and she fell forward. She had a surgical shoe at home that she used which did help. She states that she should have recurrence of pain in the bottom of her heel about re-to 4 weeks ago was had no recent treatment since then. She denies any shooting pain and she denies any pain that wakes her up. She has no other concerns.   Review of Systems  All other systems reviewed and are negative.       Objective:  Physical Exam General: AAO x3, NAD  Dermatological: Skin is warm, dry and supple bilateral. Nails x 10 are well manicured; remaining integument appears unremarkable at this time. There are no open sores, no preulcerative lesions, no rash or signs of infection present.  Vascular: Dorsalis Pedis artery and Posterior Tibial artery pedal pulses are 2/4 bilateral with immedate capillary fill time. Pedal hair growth present. . There is no pain with calf compression, swelling, warmth, erythema.   Neruologic: Grossly intact via light touch bilateral. Vibratory intact via tuning fork bilateral. Protective threshold with Semmes Wienstein monofilament intact to all pedal sites bilateral. Negative tinel sing.   Musculoskeletal: On the left foot there is tenderness palpation along the medial band of plantar fascia within the arch of the foot. There is no pain along the insertion the calcaneus there is no area pinpoint bony tenderness or pain the vibratory sensation. The plantar fascia appears to be intact today. There is no overlying edema, erythema, increase in warmth. No other areas of tenderness identified at this time. Muscular  strength 5/5 in all groups tested bilateral.  Gait: Unassisted, Nonantalgic.      Assessment:     76 year old female left arch pain likely result of plantar fasciitis/tendinitis    Plan:  -Treatment options discussed including all alternatives, risks, and complications -Etiology of symptoms were discussed -X-rays were obtained and reviewed with the patient. No evidence of acute fracture identified. -Plantar fascial brace was dispensed -Discussed ice to the area daily. -Stretching exercises were discussed. -Discussed shoe gear medications and orthotics. -I hold off on a steroid injection states the pain was mostly localized the arch. -Follow-up as scheduled or sooner if needed.  Ovid CurdMatthew Harry Shuck, DPM

## 2016-07-15 NOTE — Progress Notes (Signed)
Cardiology Office Note   Date:  07/16/2016   ID:  Tylan, Briguglio 1940-03-20, MRN [id number]  PCP:  Mila Palmer, MD    No chief complaint on file. AFib   Wt Readings from Last 3 Encounters:  07/16/16 155 lb 12.8 oz (70.7 kg)  07/04/15 154 lb (69.9 kg)  06/21/14 149 lb 1.9 oz (67.6 kg)       History of Present Illness: Marie [name] is a 76 y.o. female  With chronic atrial fibrillation.  She has used warfarin for stroke prevention for many years.  She had a recent fall.  No syncope.  She hurt her left leg but there were no broken bones.  She has an ankle brace.  THis was a mechanical fall.  Denies : Chest pain. Dizziness. Leg edema. Nitroglycerin use. Orthopnea. Palpitations. Paroxysmal nocturnal dyspnea. Shortness of breath. Syncope.   INR has been well controled.  She feels better on 4 mg of warfarin compared to 5 mg.  She has stopped eating greens.       Past Medical History:  Diagnosis Date  . Allergy   . Atrial fibrillation   . Esophageal stricture   . GERD (gastroesophageal reflux disease)   . MVP (mitral valve prolapse)   . Osteopenia   . Pre-diabetes   . Vitamin D deficiency     Past Surgical History:  Procedure Laterality Date  . ABDOMINAL HYSTERECTOMY     1984  . TONSILLECTOMY AND ADENOIDECTOMY       Current Outpatient Prescriptions  Medication Sig Dispense Refill  . atenolol (TENORMIN) 25 MG tablet Take 50 mg by mouth daily.    . cholecalciferol (VITAMIN D) 1000 UNITS tablet Take 1,000 Units by mouth daily.    . Multiple Vitamins-Minerals (CENTRUM SILVER PO) Take 1 tablet by mouth daily.     Marland Kitchen warfarin (COUMADIN) 4 MG tablet Take 4 mg by mouth. Monday through Friday    . warfarin (COUMADIN) 5 MG tablet Take 5 mg by mouth. Saturday and Sunday     No current facility-administered medications for this visit.     Allergies:   Sulfa drugs cross reactors; Adacel [diphth-acell pertussis-tetanus]; Tetanus toxoids; and Vicodin  [hydrocodone-acetaminophen]    Social History:  The patient  reports that she has never smoked. She has never used smokeless tobacco. She reports that she does not drink alcohol or use drugs.   Family History:  The patient's family history includes Atrial fibrillation in her brother and sister; Breast cancer in her mother; Healthy in her brother and sister; Heart disease in her maternal aunt; Heart failure in her father; Lymphoma in her mother.    ROS:  Please see the history of present illness.   Otherwise, review of systems are positive for recent fall.   All other systems are reviewed and negative.    PHYSICAL EXAM: VS:  BP 118/68   Pulse 78   Ht 5\' 6"  (1.676 m)   Wt 155 lb 12.8 oz (70.7 kg)   LMP  (LMP Unknown)   BMI 25.15 kg/m  , BMI Body mass index is 25.15 kg/m. GEN: Well nourished, well developed, in no acute distress  HEENT: normal  Neck: no JVD, carotid bruits, or masses Cardiac: irregularly irregular, 2/6 systolic murmur, no rubs, or gallops,no edema  Respiratory:  clear to auscultation bilaterally, normal work of breathing GI: soft, nontender, nondistended, + BS MS: no deformity or atrophy  Skin: warm and dry, no rash Neuro:  Strength and  sensation are intact Psych: euthymic mood, full affect   EKG:   The ekg ordered today demonstrates AFib, rate controlled   Recent Labs: No results found for requested labs within last 8760 hours.   Lipid Panel No results found for: CHOL, TRIG, HDL, CHOLHDL, VLDL, LDLCALC, LDLDIRECT   Other studies Reviewed: Additional studies/ records that were reviewed today with results demonstrating: EF 55-60% in 2008..   ASSESSMENT AND PLAN:  1. Atrial fibrillation : We have discussed DOAC in the past but she prefers warfarin.  Rate controlled on current dose of atenolol. 2. Aortic calcification: She has had a murmur for some time, but no sx of severe AS. 3. *Anticoagulated: Benefits of coumadin outweigh the risks.  COntinue  aggressive stroke prevention.    Current medicines are reviewed at length with the patient today.  The patient concerns regarding her medicines were addressed.  The following changes have been made:  No change  Labs/ tests ordered today include:  No orders of the defined types were placed in this encounter.   Recommend 150 minutes/week of aerobic exercise Low fat, low carb, high fiber diet recommended  Disposition:   FU in 1 year   Signed, Lance MussJayadeep Keenya Matera, MD  07/16/2016 10:12 AM    [facility] [address], [place], [place]  [address] Phone: [phone]([phone]; Fax: [phone]

## 2016-07-16 ENCOUNTER — Ambulatory Visit (INDEPENDENT_AMBULATORY_CARE_PROVIDER_SITE_OTHER): Payer: Medicare Other | Admitting: Interventional Cardiology

## 2016-07-16 ENCOUNTER — Encounter: Payer: Self-pay | Admitting: Interventional Cardiology

## 2016-07-16 VITALS — BP 118/68 | HR 78 | Ht 66.0 in | Wt 155.8 lb

## 2016-07-16 DIAGNOSIS — Z5181 Encounter for therapeutic drug level monitoring: Secondary | ICD-10-CM

## 2016-07-16 DIAGNOSIS — Z7901 Long term (current) use of anticoagulants: Secondary | ICD-10-CM | POA: Diagnosis not present

## 2016-07-16 DIAGNOSIS — I359 Nonrheumatic aortic valve disorder, unspecified: Secondary | ICD-10-CM | POA: Diagnosis not present

## 2016-07-16 DIAGNOSIS — I4891 Unspecified atrial fibrillation: Secondary | ICD-10-CM

## 2016-07-16 NOTE — Patient Instructions (Signed)

## 2016-07-28 ENCOUNTER — Ambulatory Visit: Payer: Medicare Other | Admitting: Podiatry

## 2016-07-29 ENCOUNTER — Ambulatory Visit: Payer: Medicare Other | Admitting: Podiatry

## 2016-12-16 ENCOUNTER — Emergency Department (HOSPITAL_COMMUNITY): Payer: Medicare Other

## 2016-12-16 ENCOUNTER — Encounter (HOSPITAL_COMMUNITY): Payer: Self-pay | Admitting: Emergency Medicine

## 2016-12-16 ENCOUNTER — Emergency Department (HOSPITAL_COMMUNITY)
Admission: EM | Admit: 2016-12-16 | Discharge: 2016-12-16 | Disposition: A | Discharge status: Home / Self Care | Payer: Medicare Other | Attending: Emergency Medicine | Admitting: Emergency Medicine

## 2016-12-16 DIAGNOSIS — Z79899 Other long term (current) drug therapy: Secondary | ICD-10-CM | POA: Diagnosis not present

## 2016-12-16 DIAGNOSIS — W182XXA Fall in (into) shower or empty bathtub, initial encounter: Secondary | ICD-10-CM | POA: Insufficient documentation

## 2016-12-16 DIAGNOSIS — Y939 Activity, unspecified: Secondary | ICD-10-CM | POA: Insufficient documentation

## 2016-12-16 DIAGNOSIS — Y92002 Bathroom of unspecified non-institutional (private) residence single-family (private) house as the place of occurrence of the external cause: Secondary | ICD-10-CM | POA: Diagnosis not present

## 2016-12-16 DIAGNOSIS — Z7901 Long term (current) use of anticoagulants: Secondary | ICD-10-CM | POA: Insufficient documentation

## 2016-12-16 DIAGNOSIS — S92502A Displaced unspecified fracture of left lesser toe(s), initial encounter for closed fracture: Secondary | ICD-10-CM | POA: Insufficient documentation

## 2016-12-16 DIAGNOSIS — R270 Ataxia, unspecified: Secondary | ICD-10-CM | POA: Insufficient documentation

## 2016-12-16 DIAGNOSIS — S92501A Displaced unspecified fracture of right lesser toe(s), initial encounter for closed fracture: Secondary | ICD-10-CM | POA: Insufficient documentation

## 2016-12-16 DIAGNOSIS — W19XXXA Unspecified fall, initial encounter: Secondary | ICD-10-CM

## 2016-12-16 DIAGNOSIS — Y998 Other external cause status: Secondary | ICD-10-CM | POA: Diagnosis not present

## 2016-12-16 NOTE — ED Provider Notes (Signed)
[facility]-EMERGENCY DEPT Provider Note   CSN: [id number] Arrival date & time: 12/16/16  1659     History   Chief Complaint Chief Complaint  Patient presents with  . Fall    HPI Marie [name] is a 76 y.o. female.  The history is provided by the patient and medical records.     76 y.o. F wit hx of AFIB, GERD, vit D deficiency, allergies, presenting to the ED after a fall.  Patient reports yesterday she slipped in the bathtub, hitting her right foot on the right stopper, and left foot against the side of the tub.  She denies any head injury or loss of consciousness.  She has been ambulatory since, but with worsening pain and swelling in her toes.  She does take Coumadin for A. fib, states her INR is usually within normal limits.  She is due to have this checked in 1 week. States she has been wearing postop shoe that she had at home from prior ankle surgery on the left foot, but did not have one for the right foot.  She has been able to wear her tennis shoes without too much pain.  Denies any headache, dizziness, confusion, numbness, weakness.  Past Medical History:  Diagnosis Date  . Allergy   . Atrial fibrillation   . Esophageal stricture   . GERD (gastroesophageal reflux disease)   . MVP (mitral valve prolapse)   . Osteopenia   . Pre-diabetes   . Vitamin D deficiency     Patient Active Problem List   Diagnosis Date Noted  . Anticoagulated on Coumadin 07/16/2016  . Arch pain of left foot 07/08/2016  . Aortic valve calcification 06/21/2014  . Atrial fibrillation (HCC) 06/12/2013  . Vitamin D deficiency     Past Surgical History:  Procedure Laterality Date  . ABDOMINAL HYSTERECTOMY     1984  . TONSILLECTOMY AND ADENOIDECTOMY      OB History    No data available       Home Medications    Prior to Admission medications   Medication Sig Start Date End Date Taking? Authorizing Provider  acetaminophen (TYLENOL) 500 MG tablet Take 500 mg by  mouth every 6 (six) hours as needed for moderate pain.   Yes [provider]  atenolol (TENORMIN) 25 MG tablet Take 50 mg by mouth every evening.    Yes [provider]  warfarin (COUMADIN) 4 MG tablet Take 4 mg by mouth every evening. Monday through Friday    Yes [provider]  cholecalciferol (VITAMIN D) 1000 UNITS tablet Take 1,000 Units by mouth daily.    [provider]  Multiple Vitamins-Minerals (CENTRUM SILVER PO) Take 1 tablet by mouth daily.     [provider]  warfarin (COUMADIN) 5 MG tablet Take 5 mg by mouth. Saturday and Sunday    [provider]    Family History Family History  Problem Relation Age of Onset  . Heart failure Father   . Lymphoma Mother   . Breast cancer Mother   . Healthy Sister   . Healthy Brother   . Heart disease Maternal Aunt   . Atrial fibrillation Sister   . Atrial fibrillation Brother   . Heart attack Neg Hx     Social History Social History   Tobacco Use  . Smoking status: Never Smoker  . Smokeless tobacco: Never Used  Substance Use Topics  . Alcohol use: No  . Drug use: No  Allergies   Sulfa drugs cross reactors; Adacel [diphth-acell pertussis-tetanus]; Tetanus toxoids; and Vicodin [hydrocodone-acetaminophen]   Review of Systems Review of Systems  Musculoskeletal: Positive for arthralgias.  Skin: Positive for wound.  All other systems reviewed and are negative.    Physical Exam Updated Vital Signs BP 139/79 (BP Location: Left Arm)   Pulse 75   Temp 98.3 F (36.8 C) (Oral)   Resp 18   Ht 5\' 6"  (1.676 m)   Wt 70.8 kg (156 lb)   LMP  (LMP Unknown)   SpO2 100%   BMI 25.18 kg/m   Physical Exam  Constitutional: She is oriented to person, place, and time. She appears well-developed and well-nourished.  Patient is up and pacing around room with sneakers on upon entering exam room  HENT:  Head: Normocephalic and atraumatic.  Mouth/Throat: Oropharynx is clear and  moist.  Eyes: Conjunctivae and EOM are normal. Pupils are equal, round, and reactive to light.  Neck: Normal range of motion.  Cardiovascular: Normal rate, regular rhythm and normal heart sounds.  Pulmonary/Chest: Effort normal and breath sounds normal. No stridor. No respiratory distress.  Abdominal: Soft. Bowel sounds are normal.  Musculoskeletal: Normal range of motion.  Right foot with swelling and bruising over the fifth digit and proximal fourth digit, small abrasion noted over distal 4th digit but no signs of infection; no bony deformity noted, moving toes normally Left foot with diffuse swelling and bruising of the second toe; no open wounds or sores; moving toe normally Normal distal sensation and perfusion of all toes; normal sensation throughout; DP pulses intact bilaterally  Neurological: She is alert and oriented to person, place, and time.  AAOx3, answering questions and following commands appropriately; equal strength UE and LE bilaterally; CN grossly intact; moves all extremities appropriately without ataxia; no focal neuro deficits or facial asymmetry appreciated  Skin: Skin is warm and dry.  Psychiatric: She has a normal mood and affect.  Nursing note and vitals reviewed.    ED Treatments / Results  Labs (all labs ordered are listed, but only abnormal results are displayed) Labs Reviewed - No data to display  EKG  EKG Interpretation None       Radiology Ct Head Wo Contrast  Result Date: 12/16/2016 CLINICAL DATA:  Head trauma and ataxia.  Initial encounter. EXAM: CT HEAD WITHOUT CONTRAST TECHNIQUE: Contiguous axial images were obtained from the base of the skull through the vertex without intravenous contrast. COMPARISON:  11/09/2008 FINDINGS: Brain: No evidence of acute infarction, hemorrhage, hydrocephalus, extra-axial collection or mass lesion/mass effect. Vascular: No hyperdense vessel or unexpected calcification. Skull: Normal. Negative for fracture or focal  lesion. Sinuses/Orbits: Negative IMPRESSION: Negative head CT. Electronically Signed   By: Marnee SpringJonathon  Watts M.D.   On: 12/16/2016 19:15   Dg Foot Complete Left  Result Date: 12/16/2016 CLINICAL DATA:  Patient reports fall in shower yesterday. Pt c/o bilateral generalized foot pain. Bruising noted to fourth and fifth toes on right foot and second toe on left foot. EXAM: LEFT FOOT - COMPLETE 3+ VIEW COMPARISON:  None FINDINGS: Minimally displaced fracture involving the distal portion of middle phalanx of the second digit. Osteopenia. IMPRESSION: Middle phalangeal fracture, second digit. Electronically Signed   By: Jeronimo GreavesKyle  Talbot M.D.   On: 12/16/2016 18:53   Dg Foot Complete Right  Result Date: 12/16/2016 CLINICAL DATA:  Larey SeatFell in shower yesterday.  Generalized foot pain. EXAM: RIGHT FOOT COMPLETE - 3+ VIEW COMPARISON:  None. FINDINGS: Three views study shows a transverse fracture  through the neck of the little toe proximal phalanx. No other acute fracture identified. Bones are diffusely demineralized. IMPRESSION: Transverse fracture through the neck of the little toe proximal phalanx. Electronically Signed   By: Kennith Center M.D.   On: 12/16/2016 19:05    Procedures Procedures (including critical care time)  Medications Ordered in ED Medications - No data to display   Initial Impression / Assessment and Plan / ED Course  I have reviewed the triage vital signs and the nursing notes.  Pertinent labs & imaging results that were available during my care of the patient were reviewed by me and considered in my medical decision making (see chart for details).  76 year old female here after a fall yesterday.  Slipped in the shower.  No head injury or loss of consciousness.  She is on Coumadin.  Has pain in both of her feet, mostly the toes.  She does have swelling and bruising to left second toe and right little toe.  No acute deformities noted.  Sensation intact.  Patient ambulatory with steady gait.   Screening x-rays were obtained from triage revealing fractures of the right fifth toe and left second toe.  No apparent complicating features.  CT of the head was also sent given her anticoagulation use and negative.  Patient is awake, alert, appropriately oriented here without noted deficit.  States she has been on Coumadin for years, no real issues keeping it within normal limits.  I discussed the option of checking INR here, however patient does not want to wait any longer.  She wants to go home.  She has an appointment to have her INR checked next week with her doctor.  She has been wearing postop shoe on the left foot but did not have one for her right foot.  We discussed ordering her one and she agreed this would likely be helpful, however decided at time of discharge she did not want to wait for it.  We discussed ice and elevation at home.  Close follow-up with PCP.  Discussed plan with patient, she acknowledged understanding and agreed with plan of care.  Return precautions given for new or worsening symptoms.  Final Clinical Impressions(s) / ED Diagnoses   Final diagnoses:  Fall, initial encounter  Closed fracture of phalanx of right fifth toe, initial encounter  Closed fracture of phalanx of left second toe, initial encounter    ED Discharge Orders    None       Garlon Hatchet, PA-C 12/17/16 0109    Lorre Nick, MD 12/20/16 [phone]

## 2016-12-16 NOTE — ED Triage Notes (Signed)
Patient reports fall in shower yesterday. Denies LOC, neck and back pain. C/o bilateral foot pain. Bruising noted to fourth and fifth toes on right foot and second toe on left foot. Ambulatory. Palpable pulses in bilateral feet.

## 2016-12-16 NOTE — Discharge Instructions (Signed)
You can wear the post op shoes for now. Try to ice and elevate your toes at home to help with swelling. This should gradually improve, try to monitor. Follow-up with your primary care doctor. Return here for any new/worsening symptoms.

## 2016-12-16 NOTE — ED Notes (Signed)
Patient declined post op shoes.

## 2017-01-10 ENCOUNTER — Other Ambulatory Visit: Payer: Self-pay | Admitting: Family Medicine

## 2017-01-10 DIAGNOSIS — R1030 Lower abdominal pain, unspecified: Secondary | ICD-10-CM

## 2017-01-10 DIAGNOSIS — Q6479 Other congenital malformations of bladder and urethra: Secondary | ICD-10-CM

## 2017-01-10 DIAGNOSIS — R319 Hematuria, unspecified: Secondary | ICD-10-CM

## 2017-02-08 ENCOUNTER — Ambulatory Visit
Admission: RE | Admit: 2017-02-08 | Discharge: 2017-02-08 | Disposition: A | Discharge status: Home / Self Care | Payer: Medicare Other | Source: Ambulatory Visit | Attending: Family Medicine | Admitting: Family Medicine

## 2017-02-08 DIAGNOSIS — Q6479 Other congenital malformations of bladder and urethra: Secondary | ICD-10-CM

## 2017-02-08 DIAGNOSIS — R1030 Lower abdominal pain, unspecified: Secondary | ICD-10-CM

## 2017-02-08 DIAGNOSIS — R319 Hematuria, unspecified: Secondary | ICD-10-CM

## 2017-07-17 NOTE — Progress Notes (Signed)
Cardiology Office Note   Date:  07/19/2017   ID:  Marie [name], Marie [name] 1940-11-30, MRN [id number]  PCP:  Marie Palmer, MD    No chief complaint on file.  AFib  Wt Readings from Last 3 Encounters:  07/19/17 154 lb (69.9 kg)  12/16/16 156 lb (70.8 kg)  07/16/16 155 lb 12.8 oz (70.7 kg)       History of Present Illness: Marie [name] is a 77 y.o. female   With chronic atrial fibrillation.  She has used warfarin for stroke prevention for many years.  Had a fall in 2018.  INR has been well controled.  She feels better on 4 mg of warfarin compared to 5 mg.  She has stopped eating greens.    She fell again in 11/18 and broke some toes, slipping out of the bathtub.  Denies : Chest pain. Dizziness. Leg edema. Nitroglycerin use. Orthopnea. Palpitations. Paroxysmal nocturnal dyspnea. Shortness of breath. Syncope.   Coumadin dose has been stable.   Walking for exercise in the house.  She does some chair exercises as well.      Past Medical History:  Diagnosis Date  . Allergy   . Atrial fibrillation   . Esophageal stricture   . GERD (gastroesophageal reflux disease)   . MVP (mitral valve prolapse)   . Osteopenia   . Pre-diabetes   . Vitamin D deficiency     Past Surgical History:  Procedure Laterality Date  . ABDOMINAL HYSTERECTOMY     1984  . TONSILLECTOMY AND ADENOIDECTOMY       Current Outpatient Medications  Medication Sig Dispense Refill  . acetaminophen (TYLENOL) 500 MG tablet Take 500 mg by mouth every 6 (six) hours as needed for moderate pain.    Marland Kitchen atenolol (TENORMIN) 25 MG tablet Take 50 mg by mouth every evening.     . cholecalciferol (VITAMIN D) 1000 UNITS tablet Take 1,000 Units by mouth daily.    . Multiple Vitamins-Minerals (CENTRUM SILVER PO) Take 1 tablet by mouth daily.     Marland Kitchen warfarin (COUMADIN) 4 MG tablet Take 4 mg by mouth every evening.     . warfarin (COUMADIN) 5 MG tablet Take 5 mg by mouth. Saturday and Sunday     No current  facility-administered medications for this visit.     Allergies:   Sulfa drugs cross reactors; Adacel [tetanus-diphth-acell pertussis]; Tetanus toxoids; and Vicodin [hydrocodone-acetaminophen]    Social History:  The patient  reports that she has never smoked. She has never used smokeless tobacco. She reports that she does not drink alcohol or use drugs.   Family History:  The patient's family history includes Atrial fibrillation in her brother and sister; Breast cancer in her mother; Healthy in her brother and sister; Heart disease in her maternal aunt; Heart failure in her father; Lymphoma in her mother.    ROS:  Please see the history of present illness.   Otherwise, review of systems are positive for left leg pain after twisting leg.   All other systems are reviewed and negative.    PHYSICAL EXAM: VS:  BP 130/78   Pulse 77   Ht 5\' 6"  (1.676 m)   Wt 154 lb (69.9 kg)   LMP  (LMP Unknown)   SpO2 98%   BMI 24.86 kg/m  , BMI Body mass index is 24.86 kg/m. GEN: Well nourished, well developed, in no acute distress  HEENT: normal  Neck: no JVD, carotid bruits, or masses Cardiac: irregularly irregular;  2/6 systolic murmur, no rubs, or gallops,no edema  Respiratory:  clear to auscultation bilaterally, normal work of breathing GI: soft, nontender, nondistended, + BS MS: no deformity or atrophy  Skin: warm and dry, no rash Neuro:  Strength and sensation are intact Psych: euthymic mood, full affect   EKG:   The ekg ordered today demonstrates AFib, rate controlled   Recent Labs: No results found for requested labs within last 8760 hours.   Lipid Panel No results found for: CHOL, TRIG, HDL, CHOLHDL, VLDL, LDLCALC, LDLDIRECT   Other studies Reviewed: Additional studies/ records that were reviewed today with results demonstrating: LDL 73 in 6/19.   ASSESSMENT AND PLAN:  1. AFib: rate controlled.  COntinue atenolol.  COntinue current meds.   2. Aortic calcification: No signs of  severe AS.  No near syncope or syncope.  Murmur is mild. 3. Anticoagulated: No bleeding problems.  Benefits of anticoagulation outweigh the risk.  COntinue stroke prevention with Coumadin. Avoid falls.   Current medicines are reviewed at length with the patient today.  The patient concerns regarding her medicines were addressed.  The following changes have been made:  No change  Labs/ tests ordered today include:  No orders of the defined types were placed in this encounter.   Recommend 150 minutes/week of aerobic exercise Low fat, low carb, high fiber diet recommended  Disposition:   FU in 1 year   Signed, Lance MussJayadeep Michaiah Holsopple, MD  07/19/2017 9:54 AM    [facility] [address], [place], [place]  [address] Phone: [phone]; Fax: [phone]

## 2017-07-19 ENCOUNTER — Encounter (INDEPENDENT_AMBULATORY_CARE_PROVIDER_SITE_OTHER): Payer: Self-pay

## 2017-07-19 ENCOUNTER — Encounter: Payer: Self-pay | Admitting: Interventional Cardiology

## 2017-07-19 ENCOUNTER — Ambulatory Visit: Payer: Medicare Other | Admitting: Interventional Cardiology

## 2017-07-19 VITALS — BP 130/78 | HR 77 | Ht 66.0 in | Wt 154.0 lb

## 2017-07-19 DIAGNOSIS — I4819 Other persistent atrial fibrillation: Secondary | ICD-10-CM

## 2017-07-19 DIAGNOSIS — I359 Nonrheumatic aortic valve disorder, unspecified: Secondary | ICD-10-CM

## 2017-07-19 DIAGNOSIS — I481 Persistent atrial fibrillation: Secondary | ICD-10-CM | POA: Diagnosis not present

## 2017-07-19 DIAGNOSIS — Z7901 Long term (current) use of anticoagulants: Secondary | ICD-10-CM | POA: Diagnosis not present

## 2017-07-19 NOTE — Patient Instructions (Signed)

## 2018-07-18 ENCOUNTER — Telehealth: Payer: Self-pay

## 2018-07-18 NOTE — Telephone Encounter (Signed)
Patient will come in to the office for her appt tomorrow with ML. Patient answers no to all screening questions below. Patient understands that there are no visitors allowed. Patient understands not to arrive more than 15 minutes prior to the scheduled appointment time and that a mask will be required for the visit.      COVID-19 Pre-Screening Questions:  . In the past 7 to 10 days have you had a cough,  shortness of breath, headache, congestion, fever (100 or greater) body aches, chills, sore throat, or sudden loss of taste or sense of smell? NO . Have you been around anyone with known Covid 19? NO . Have you been around anyone who is awaiting Covid 19 test results in the past 7 to 10 days? NO . Have you been around anyone who has been exposed to Covid 19, or has mentioned symptoms of Covid 19 within the past 7 to 10 days? NO

## 2018-07-18 NOTE — Progress Notes (Signed)
Cardiology Office Note    Date:  07/19/2018   ID:  Marie, [name] 01-25-1940, MRN [id number]  PCP:  Jonathon Jordan, MD  Cardiologist: Larae Grooms, MD EPS: None  Chief Complaint  Patient presents with  . Follow-up    History of Present Illness:  Marie [name] is a 78 y.o. female with chronic atrial fibrillation on coumadin. Has had some falls, Aortic calcification.  Last saw Dr. Irish Lack 07/2017 and doing well.  Denies chest pain, palpitations, dyspnea, dyspnea on exertion,  or presyncope. No falls in the past year. Says she gets dizzy and off balance when walking around. Not always when she first gets up.Afraid of driving and going to the grocery store.  Thinks she's on too much atenolol.  Past Medical History:  Diagnosis Date  . Allergy   . Atrial fibrillation   . Esophageal stricture   . GERD (gastroesophageal reflux disease)   . MVP (mitral valve prolapse)   . Osteopenia   . Pre-diabetes   . Vitamin D deficiency     Past Surgical History:  Procedure Laterality Date  . ABDOMINAL HYSTERECTOMY     1984  . TONSILLECTOMY AND ADENOIDECTOMY      Current Medications: Current Meds  Medication Sig  . acetaminophen (TYLENOL) 500 MG tablet Take 500 mg by mouth every 6 (six) hours as needed for moderate pain.  Marland Kitchen atenolol (TENORMIN) 25 MG tablet Take 50 mg by mouth every evening.   . cholecalciferol (VITAMIN D) 1000 UNITS tablet Take 1,000 Units by mouth daily.  . Multiple Vitamins-Minerals (CENTRUM SILVER PO) Take 1 tablet by mouth daily.   Marland Kitchen warfarin (COUMADIN) 4 MG tablet Take 4 mg by mouth every evening.      Allergies:   Sulfa drugs cross reactors, Adacel [tetanus-diphth-acell pertussis], Tetanus toxoids, and Vicodin [hydrocodone-acetaminophen]   Social History   Socioeconomic History  . Marital status: Single    Spouse name: Not on file  . Number of children: Not on file  . Years of education: Not on file  . Highest education level: Not on file   Occupational History  . Occupation: retired  Scientific laboratory technician  . Financial resource strain: Not on file  . Food insecurity    Worry: Not on file    Inability: Not on file  . Transportation needs    Medical: Not on file    Non-medical: Not on file  Tobacco Use  . Smoking status: Never Smoker  . Smokeless tobacco: Never Used  Substance and Sexual Activity  . Alcohol use: No  . Drug use: No  . Sexual activity: Not on file  Lifestyle  . Physical activity    Days per week: Not on file    Minutes per session: Not on file  . Stress: Not on file  Relationships  . Social Herbalist on phone: Not on file    Gets together: Not on file    Attends religious service: Not on file    Active member of club or organization: Not on file    Attends meetings of clubs or organizations: Not on file    Relationship status: Not on file  Other Topics Concern  . Not on file  Social History Narrative  . Not on file     Family History:  The patient's   family history includes Atrial fibrillation in her brother and sister; Breast cancer in her mother; Healthy in her brother and sister; Heart disease in her maternal  aunt; Heart failure in her father; Lymphoma in her mother.   ROS:   Please see the history of present illness.    Review of Systems  Constitution: Negative.  HENT: Negative.   Eyes: Negative.   Cardiovascular: Negative.   Respiratory: Negative.   Hematologic/Lymphatic: Negative.   Musculoskeletal: Negative.  Negative for joint pain.  Gastrointestinal: Negative.   Genitourinary: Negative.   Neurological: Negative.    All other systems reviewed and are negative.   PHYSICAL EXAM:   VS:  BP 128/68   Pulse 70   Ht 5\' 6"  (1.676 m)   Wt 154 lb 6.4 oz (70 kg)   LMP  (LMP Unknown)   SpO2 96%   BMI 24.92 kg/m   Physical Exam  GEN: Well nourished, well developed, in no acute distress  Neck: no JVD, carotid bruits, or masses Cardiac:RRR; 2/6 systolic murmur LSB, and apex  Respiratory:  clear to auscultation bilaterally, normal work of breathing GI: soft, nontender, nondistended, + BS Ext: without cyanosis, clubbing, or edema, Good distal pulses bilaterally Neuro:  Alert and Oriented x 3 Psych: euthymic mood, full affect  Wt Readings from Last 3 Encounters:  07/19/18 154 lb 6.4 oz (70 kg)  07/19/17 154 lb (69.9 kg)  12/16/16 156 lb (70.8 kg)      Studies/Labs Reviewed:   EKG:  EKG is ordered today.  The ekg ordered today demonstrates Atrial fibrillation at 74/m  With poor R wave progression-no change from last EKG.   Recent Labs: No results found for requested labs within last 8760 hours.   Lipid Panel No results found for: CHOL, TRIG, HDL, CHOLHDL, VLDL, LDLCALC, LDLDIRECT  Additional studies/ records that were reviewed today include:       ASSESSMENT:    1. Longstanding persistent atrial fibrillation   2. Anticoagulated on Coumadin   3. Aortic valve calcification   4. Dizziness   5. Fall, sequela      PLAN:  In order of problems listed above:   Persistent Afib on coumadin- doing well without tachycardia. Coumadin managed by PCP and having labs drawn 08/10/18  Dizziness and off balance. She thinks it's from the atenolol. I offered to decrease it to 25 mg once daily and send patient BP cuff to monitor BP and HR but she declined.   Aortic valve calcification-no echo since 2009-AS and MR murmurs on exam. Recommend repeat echo but patient declined.   Falls in the past but not in the last year.   Medication Adjustments/Labs and Tests Ordered: Current medicines are reviewed at length with the patient today.  Concerns regarding medicines are outlined above.  Medication changes, Labs and Tests ordered today are listed in the Patient Instructions below. Patient Instructions  Medication Instructions:  Your physician recommends that you continue on your current medications as directed. Please refer to the Current Medication list given to  you today.  If you need a refill on your cardiac medications before your next appointment, please call your pharmacy.   Lab work:  None ordered today  Testing/Procedures:  None ordered today  Follow-Up: At [facility], you and your health needs are our priority.  As part of our continuing mission to provide you with exceptional heart care, we have created designated Provider Care Teams.  These Care Teams include your primary Cardiologist (physician) and Advanced Practice Providers (APPs -  Physician Assistants and Nurse Practitioners) who all work together to provide you with the care you need, when you need it. You will  need a follow up appointment in 12 months.  Please call our office 2 months in advance to schedule this appointment.  You may see Lance MussJayadeep Varanasi, MD or one of the following Advanced Practice Providers on your designated Care Team:   [place] FallsBrittainy Simmons, PA-C Ronie Spiesayna Dunn, PA-C . Jacolyn ReedyMichele Charlotta Lapaglia, PA-C       Signed, Jacolyn ReedyMichele Starr Urias, PA-C  07/19/2018 9:28 AM    [facility] [address], [place], [place]  [address] Phone: [phone]; Fax: [phone]

## 2018-07-19 ENCOUNTER — Encounter (INDEPENDENT_AMBULATORY_CARE_PROVIDER_SITE_OTHER): Payer: Self-pay

## 2018-07-19 ENCOUNTER — Encounter: Payer: Self-pay | Admitting: Physician Assistant

## 2018-07-19 ENCOUNTER — Ambulatory Visit (INDEPENDENT_AMBULATORY_CARE_PROVIDER_SITE_OTHER): Payer: Medicare Other | Admitting: Physician Assistant

## 2018-07-19 ENCOUNTER — Other Ambulatory Visit: Payer: Self-pay

## 2018-07-19 VITALS — BP 128/68 | HR 70 | Ht 66.0 in | Wt 154.4 lb

## 2018-07-19 DIAGNOSIS — I4811 Longstanding persistent atrial fibrillation: Secondary | ICD-10-CM | POA: Diagnosis not present

## 2018-07-19 DIAGNOSIS — I359 Nonrheumatic aortic valve disorder, unspecified: Secondary | ICD-10-CM | POA: Diagnosis not present

## 2018-07-19 DIAGNOSIS — R42 Dizziness and giddiness: Secondary | ICD-10-CM | POA: Diagnosis not present

## 2018-07-19 DIAGNOSIS — Z7901 Long term (current) use of anticoagulants: Secondary | ICD-10-CM | POA: Diagnosis not present

## 2018-07-19 DIAGNOSIS — W19XXXS Unspecified fall, sequela: Secondary | ICD-10-CM

## 2018-07-19 NOTE — Patient Instructions (Signed)
Medication Instructions:  Your physician recommends that you continue on your current medications as directed. Please refer to the Current Medication list given to you today.  If you need a refill on your cardiac medications before your next appointment, please call your pharmacy.   Lab work:  None ordered today  Testing/Procedures:  None ordered today  Follow-Up: At [facility], you and your health needs are our priority.  As part of our continuing mission to provide you with exceptional heart care, we have created designated Provider Care Teams.  These Care Teams include your primary Cardiologist (physician) and Advanced Practice Providers (APPs -  Physician Assistants and Nurse Practitioners) who all work together to provide you with the care you need, when you need it. You will need a follow up appointment in 12 months.  Please call our office 2 months in advance to schedule this appointment.  You may see Jayadeep Varanasi, MD or one of the following Advanced Practice Providers on your designated Care Team:   Brittainy Simmons, PA-C Dayna Dunn, PA-C . Michele Lenze, PA-C     

## 2019-05-17 IMAGING — CT CT HEAD W/O CM
3 of 4 series · 14 of 47 positions shown, 16 images · non-contrast
Comparison: 11/09/2008

CLINICAL DATA: Head trauma and ataxia.  Initial encounter.

EXAM:
CT HEAD WITHOUT CONTRAST
TECHNIQUE: Contiguous axial images were obtained from the base of the skull
through the vertex without intravenous contrast.

[Series 2: head w/o · axial · non-contrast · 0.45mm/px · z∈[-216,-96]mm · 8 of 29 slices shown, 10 images]
[im 3/29  brain]
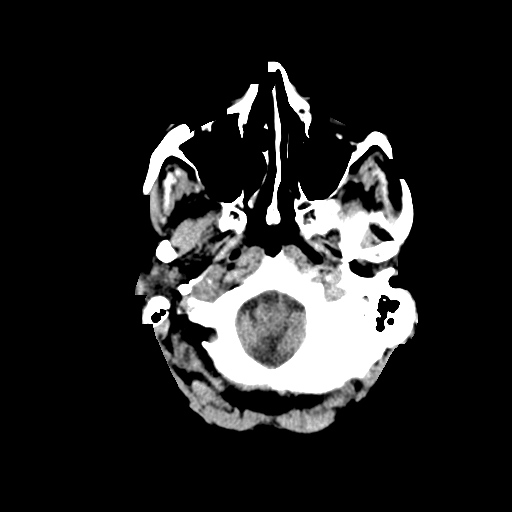
[im 3/29  bone]
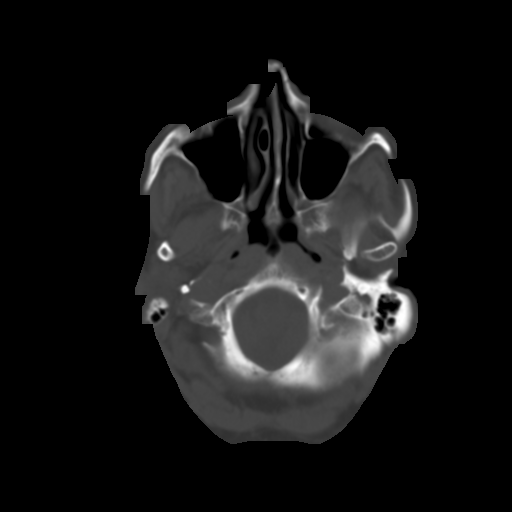
[im 7/29  brain]
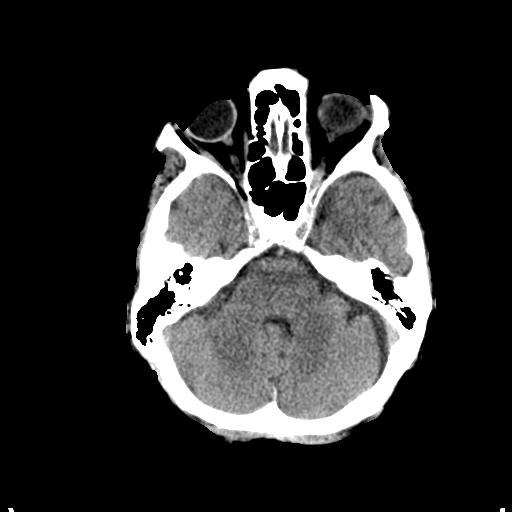
[im 11/29  brain]
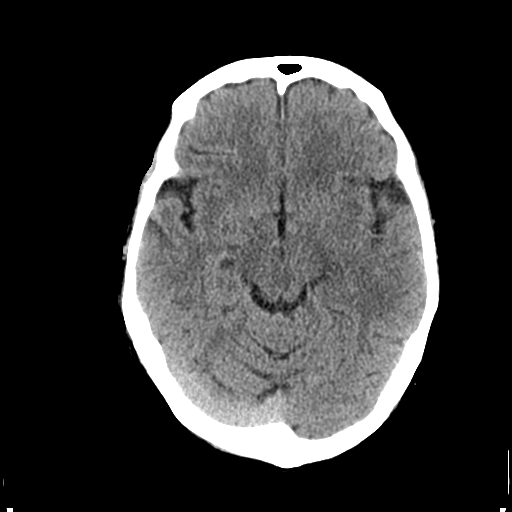
[im 13/29  brain]
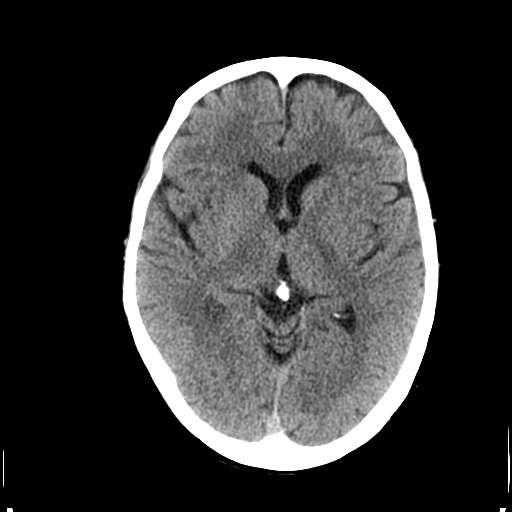
[im 17/29  brain]
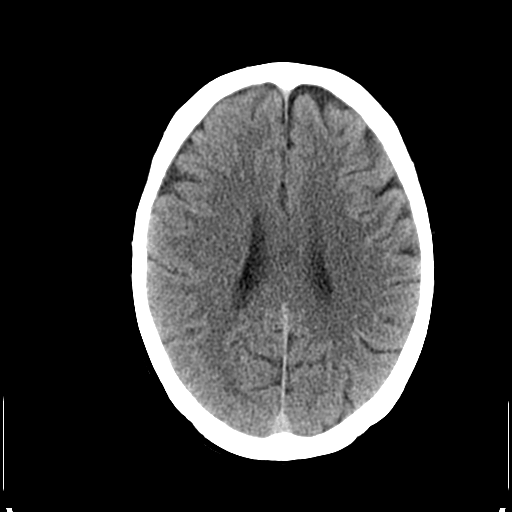
[im 17/29  bone]
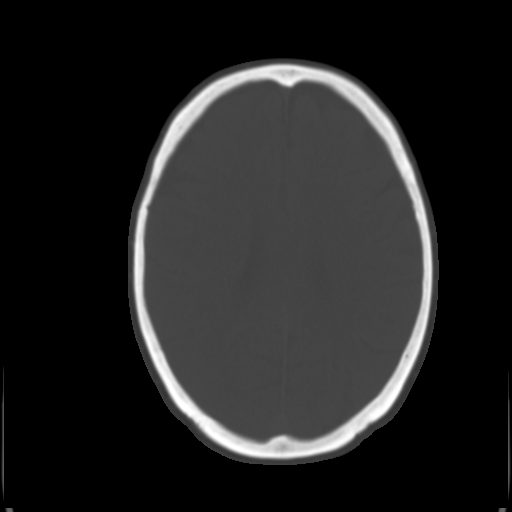
[im 19/29  brain]
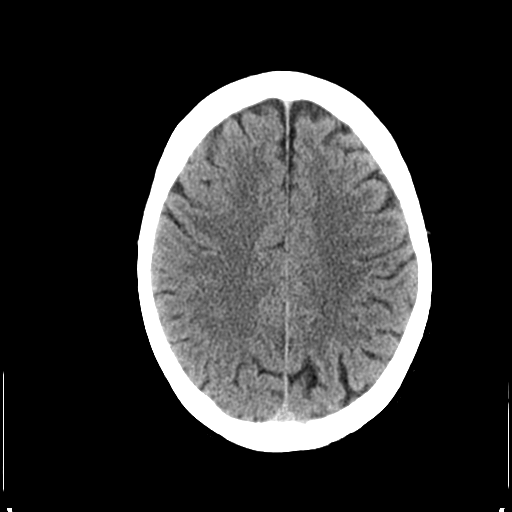
[im 23/29  brain]
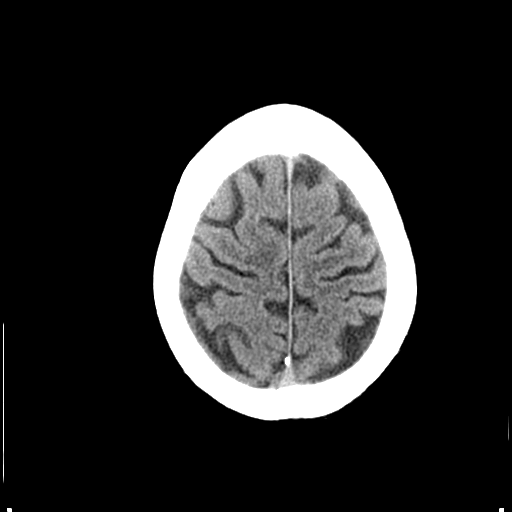
[im 27/29  brain]
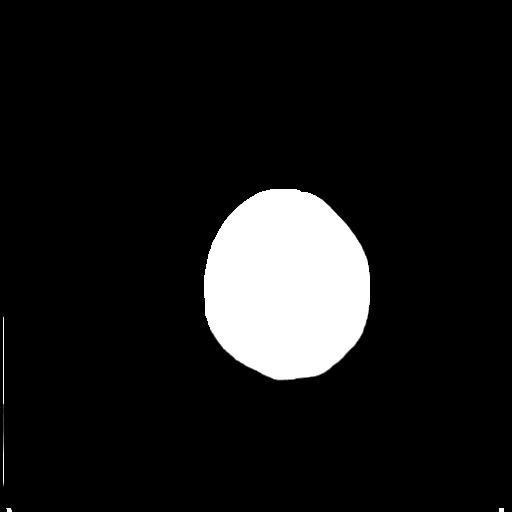

[Series 4: coronal · coronal · 0.28mm/px · 3 of 77 slices shown]
[im 26/77  brain]
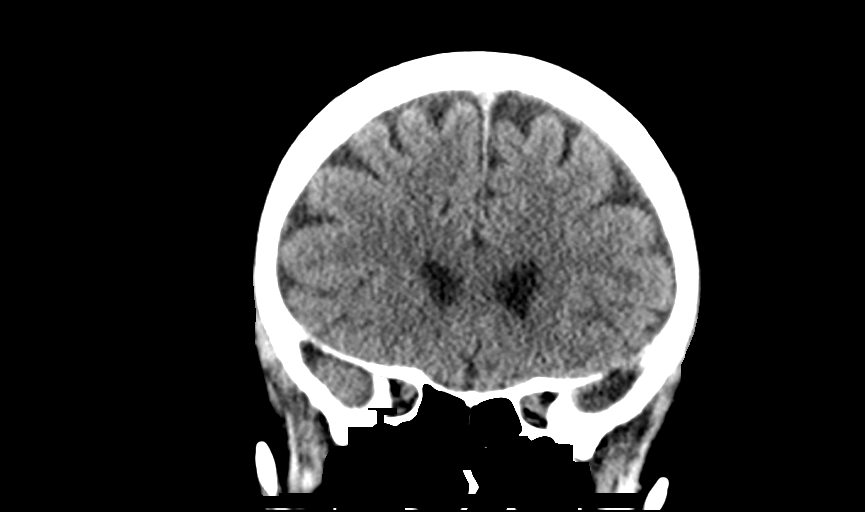
[im 34/77  brain]
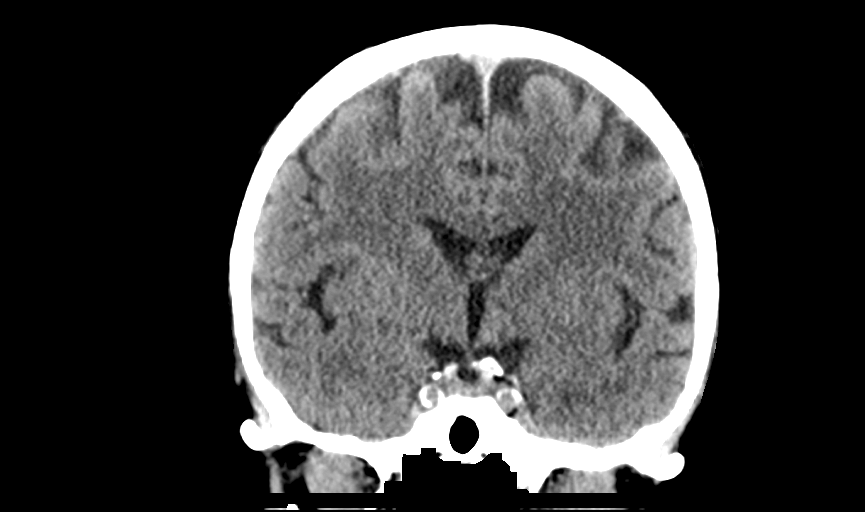
[im 43/77  brain]
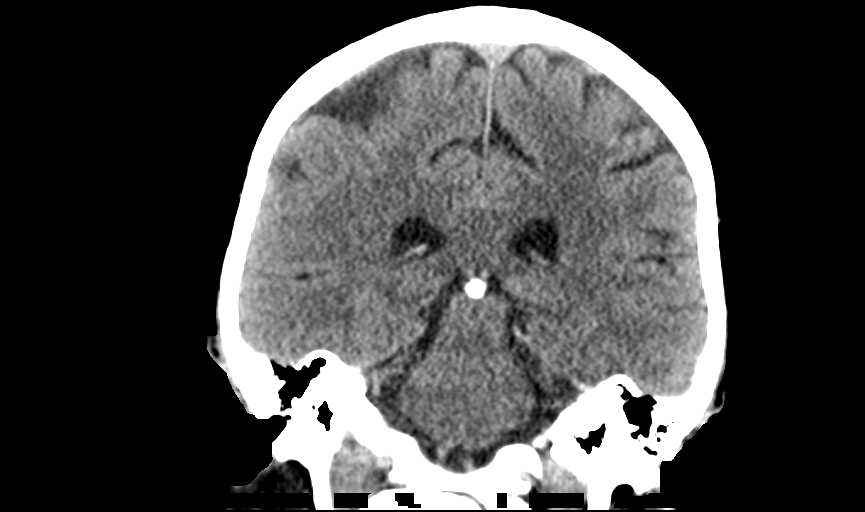

[Series 5: sagittal · sagittal · 0.28mm/px · 3 of 63 slices shown]
[im 21/63  brain]
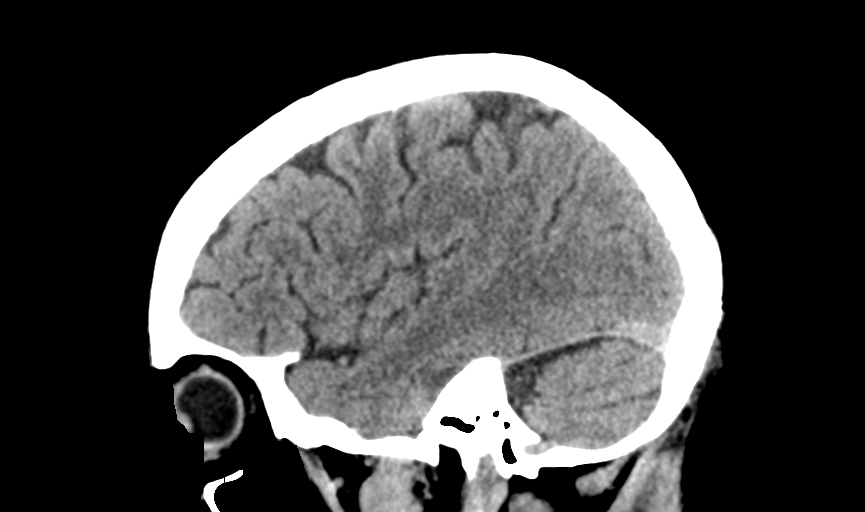
[im 32/63  brain]
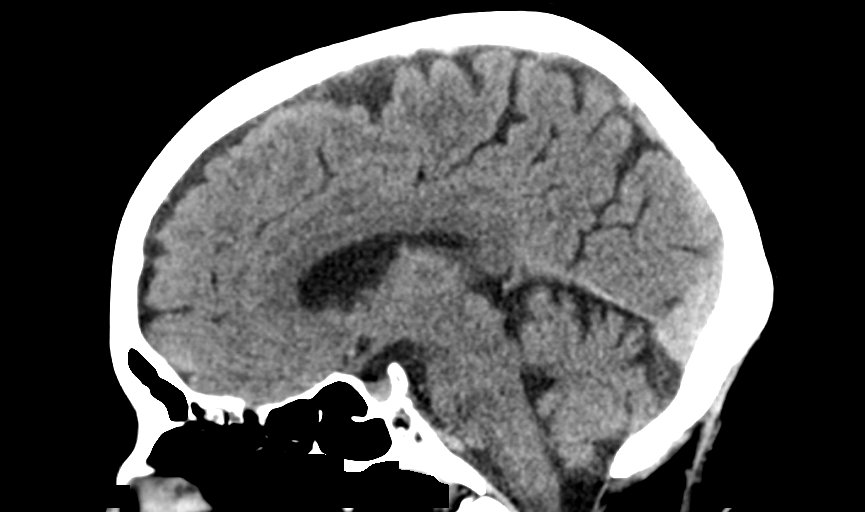
[im 42/63  brain]
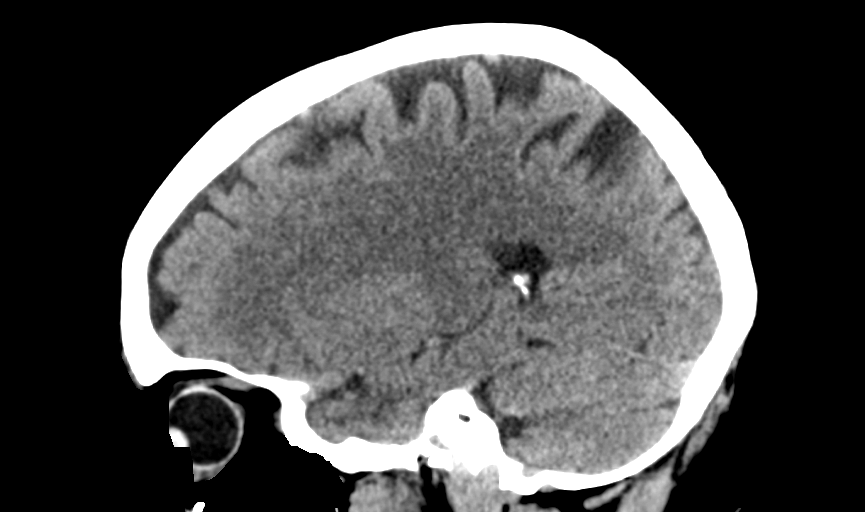

[14 of 47 positions shown; findings below may reference images not displayed]

FINDINGS: Brain: No evidence of acute infarction, hemorrhage, hydrocephalus,
extra-axial collection or mass lesion/mass effect.

Vascular: No hyperdense vessel or unexpected calcification.

Skull: Normal. Negative for fracture or focal lesion.

Sinuses/Orbits: Negative
IMPRESSION: Negative head CT.

## 2019-07-24 NOTE — Progress Notes (Signed)
Cardiology Office Note   Date:  07/26/2019   ID:  [name], Marie [name] [birth year], MRN [id number]  PCP:  Mila Palmer, MD    No chief complaint on file.  AFib  Wt Readings from Last 3 Encounters:  07/26/19 154 lb (69.9 kg)  07/19/18 154 lb 6.4 oz (70 kg)  07/19/17 154 lb (69.9 kg)       History of Present Illness: Marie [name] is a 79 y.o. female  With chronic atrial fibrillation. She has used warfarin for stroke prevention for many years.  Had a fall in 2018.  INR has been well controled. She feels better on 4 mg of warfarin compared to 5 mg. She has stopped eating greens.   She fell again in 11/18 and broke some toes, slipping out of the bathtub.  Denies : Chest pain. Dizziness. Leg edema. Nitroglycerin use. Orthopnea. Palpitations. Paroxysmal nocturnal dyspnea. Shortness of breath. Syncope.   No recent falls.  Not walking much outside of the house.     Past Medical History:  Diagnosis Date   Allergy    Atrial fibrillation    Esophageal stricture    GERD (gastroesophageal reflux disease)    MVP (mitral valve prolapse)    Osteopenia    Pre-diabetes    Vitamin D deficiency     Past Surgical History:  Procedure Laterality Date   ABDOMINAL HYSTERECTOMY     1984   TONSILLECTOMY AND ADENOIDECTOMY       Current Outpatient Medications  Medication Sig Dispense Refill   acetaminophen (TYLENOL) 500 MG tablet Take 500 mg by mouth every 6 (six) hours as needed for moderate pain.     atenolol (TENORMIN) 25 MG tablet Take 50 mg by mouth every evening.      cholecalciferol (VITAMIN D) 1000 UNITS tablet Take 1,000 Units by mouth daily.     Multiple Vitamins-Minerals (CENTRUM SILVER PO) Take 1 tablet by mouth daily.      warfarin (COUMADIN) 4 MG tablet Take 4 mg by mouth every evening.      No current facility-administered medications for this visit.    Allergies:   Sulfa drugs cross reactors, Adacel [tetanus-diphth-acell pertussis], Tetanus  toxoids, and Vicodin [hydrocodone-acetaminophen]    Social History:  The patient  reports that she has never smoked. She has never used smokeless tobacco. She reports that she does not drink alcohol and does not use drugs.   Family History:  The patient's family history includes Atrial fibrillation in her brother and sister; Breast cancer in her mother; Healthy in her brother and sister; Heart disease in her maternal aunt; Heart failure in her father; Lymphoma in her mother.    ROS:  Please see the history of present illness.   Otherwise, review of systems are positive for rare joint pains.   All other systems are reviewed and negative.    PHYSICAL EXAM: VS:  BP 118/78    Pulse 85    Ht 5\' 6"  (1.676 m)    Wt 154 lb (69.9 kg)    LMP  (LMP Unknown)    SpO2 94%    BMI 24.86 kg/m  , BMI Body mass index is 24.86 kg/m. GEN: Well nourished, well developed, in no acute distress  HEENT: normal  Neck: no JVD, carotid bruits, or masses Cardiac: irregularly irregular; no murmurs, rubs, or gallops,no edema  Respiratory:  clear to auscultation bilaterally, normal work of breathing GI: soft, nontender, nondistended, + BS MS: no deformity or atrophy  Skin: warm and dry, no rash Neuro:  Strength and sensation are intact Psych: euthymic mood, full affect   EKG:   The ekg ordered today demonstrates AFib, rate controlled.    Recent Labs: No results found for requested labs within last 8760 hours.   Lipid Panel No results found for: CHOL, TRIG, HDL, CHOLHDL, VLDL, LDLCALC, LDLDIRECT   Other studies Reviewed: Additional studies/ records that were reviewed today with results demonstrating: LDL 81 in 2020.   ASSESSMENT AND PLAN:  1. Atrial fibrillation: Rate controlled with atenolol.  Warfarin for stroke prevention.   2. Aortic valve calcification: Mild aortic valve calcification noted back in 2009.  She has been hesitant to repeat echo since that time.  Murmur persists on exam but does not  appear to be significant AS.  3. Anticoagulated: No bleeding problems.  Hbg to be rechecked in Aug 2021.   4. Increase exercise.  Trying to eat healthy.     Current medicines are reviewed at length with the patient today.  The patient concerns regarding her medicines were addressed.  The following changes have been made:  No change  Labs/ tests ordered today include:  No orders of the defined types were placed in this encounter.   Recommend 150 minutes/week of aerobic exercise Low fat, low carb, high fiber diet recommended  Disposition:   FU in 1 year   Signed, Lance Muss, MD  07/26/2019 10:43 AM    [facility] [address] [place], [place], [place]  [address] Phone: [phone]; Fax: [phone]

## 2019-07-26 ENCOUNTER — Other Ambulatory Visit: Payer: Self-pay

## 2019-07-26 ENCOUNTER — Ambulatory Visit: Payer: Medicare Other | Admitting: Interventional Cardiology

## 2019-07-26 ENCOUNTER — Encounter: Payer: Self-pay | Admitting: Interventional Cardiology

## 2019-07-26 VITALS — BP 118/78 | HR 85 | Ht 66.0 in | Wt 154.0 lb

## 2019-07-26 DIAGNOSIS — I359 Nonrheumatic aortic valve disorder, unspecified: Secondary | ICD-10-CM

## 2019-07-26 DIAGNOSIS — I4811 Longstanding persistent atrial fibrillation: Secondary | ICD-10-CM | POA: Diagnosis not present

## 2019-07-26 DIAGNOSIS — Z7901 Long term (current) use of anticoagulants: Secondary | ICD-10-CM

## 2019-07-26 NOTE — Patient Instructions (Signed)

## 2020-02-11 DIAGNOSIS — Z7901 Long term (current) use of anticoagulants: Secondary | ICD-10-CM | POA: Diagnosis not present

## 2020-02-25 DIAGNOSIS — Z7901 Long term (current) use of anticoagulants: Secondary | ICD-10-CM | POA: Diagnosis not present

## 2020-03-10 DIAGNOSIS — Z7901 Long term (current) use of anticoagulants: Secondary | ICD-10-CM | POA: Diagnosis not present

## 2020-03-17 DIAGNOSIS — Z7901 Long term (current) use of anticoagulants: Secondary | ICD-10-CM | POA: Diagnosis not present

## 2020-04-14 DIAGNOSIS — Z7901 Long term (current) use of anticoagulants: Secondary | ICD-10-CM | POA: Diagnosis not present

## 2020-05-26 DIAGNOSIS — Z7901 Long term (current) use of anticoagulants: Secondary | ICD-10-CM | POA: Diagnosis not present

## 2020-06-06 DIAGNOSIS — Z7901 Long term (current) use of anticoagulants: Secondary | ICD-10-CM | POA: Diagnosis not present

## 2020-07-11 DIAGNOSIS — Z7901 Long term (current) use of anticoagulants: Secondary | ICD-10-CM | POA: Diagnosis not present

## 2020-08-10 NOTE — Progress Notes (Signed)
Cardiology Office Note   Date:  08/11/2020   ID:  Marie [name], Marie [name] [birth year], MRN [id number]  PCP:  Mila Palmer, MD    No chief complaint on file.  Atrial fibrillation  Wt Readings from Last 3 Encounters:  08/11/20 156 lb (70.8 kg)  07/26/19 154 lb (69.9 kg)  07/19/18 154 lb 6.4 oz (70 kg)       History of Present Illness: Marie [name] is a 80 y.o. female  With chronic atrial fibrillation.  She has used warfarin for stroke prevention for many years.   Had a fall in 2018.  INR has been well controled.  She feels better on 4 mg of warfarin compared to 5 mg.  She has stopped eating greens.     She fell again in 11/18 and broke some toes, slipping out of the bathtub.  Denies : Chest pain. Dizziness. Leg edema. Nitroglycerin use. Orthopnea. Palpitations. Paroxysmal nocturnal dyspnea. Shortness of breath. Syncope.    She rolled out of bed one time but was not hurt.  No other falls.  No blood in stool or urine.  Se has had her initial COVID vaccines. She has not had a booster.   Past Medical History:  Diagnosis Date   Allergy    Atrial fibrillation    Esophageal stricture    GERD (gastroesophageal reflux disease)    MVP (mitral valve prolapse)    Osteopenia    Pre-diabetes    Vitamin D deficiency     Past Surgical History:  Procedure Laterality Date   ABDOMINAL HYSTERECTOMY     1984   TONSILLECTOMY AND ADENOIDECTOMY       Current Outpatient Medications  Medication Sig Dispense Refill   acetaminophen (TYLENOL) 500 MG tablet Take 500 mg by mouth every 6 (six) hours as needed for moderate pain.     atenolol (TENORMIN) 25 MG tablet Take 50 mg by mouth every evening.      cholecalciferol (VITAMIN D) 1000 UNITS tablet Take 1,000 Units by mouth daily.     Multiple Vitamins-Minerals (CENTRUM SILVER PO) Take 1 tablet by mouth daily.      warfarin (COUMADIN) 4 MG tablet Take 4 mg by mouth every evening.      No current facility-administered medications for  this visit.    Allergies:   Sulfa drugs cross reactors, Adacel [tetanus-diphth-acell pertussis], Tetanus toxoids, and Vicodin [hydrocodone-acetaminophen]    Social History:  The patient  reports that she has never smoked. She has never used smokeless tobacco. She reports that she does not drink alcohol and does not use drugs.   Family History:  The patient's family history includes Atrial fibrillation in her brother and sister; Breast cancer in her mother; Healthy in her brother and sister; Heart disease in her maternal aunt; Heart failure in her father; Lymphoma in her mother.    ROS:  Please see the history of present illness.   Otherwise, review of systems are positive for easy bruising.   All other systems are reviewed and negative.    PHYSICAL EXAM: VS:  BP 128/78   Pulse 69   Ht 5\' 6"  (1.676 m)   Wt 156 lb (70.8 kg)   LMP  (LMP Unknown)   SpO2 96%   BMI 25.18 kg/m  , BMI Body mass index is 25.18 kg/m. GEN: Well nourished, well developed, in no acute distress HEENT: normal Neck: no JVD, carotid bruits, or masses Cardiac: irregularly irregular; 2/6 systolic murmur, rubs, or gallops,no edema  Respiratory:  clear to auscultation bilaterally, normal work of breathing GI: soft, nontender, nondistended, + BS MS: no deformity or atrophy Skin: warm and dry, no rash Neuro:  Strength and sensation are intact, slow gait Psych: euthymic mood, full affect   EKG:   The ekg ordered today demonstrates AFib,rate controlled, no ST changes   Recent Labs: No results found for requested labs within last 8760 hours.   Lipid Panel No results found for: CHOL, TRIG, HDL, CHOLHDL, VLDL, LDLCALC, LDLDIRECT   Other studies Reviewed: Additional studies/ records that were reviewed today with results demonstrating: labs reviewed, LDL 77 in 8/21.  Hbg 12.9 in 2021.   ASSESSMENT AND PLAN:  Atrial fibrillation: Rate controlled.  Continue rate control meds.  BP controlled.  Aortic valve  calcification: No signs of CHF.  Many years ago since last echocardiogram.  Murmur is present but she is not interested in an echo.  No signs or symptoms of significant aortic stenosis. Anticoagulated: Coumadin for stroke prevention.  No bleeding problems.  Steady dose of late.  Eats a steady amount of green beans. Her 47 y/o brother is also on Coumadin.    Current medicines are reviewed at length with the patient today.  The patient concerns regarding her medicines were addressed.  The following changes have been made:  No change  Labs/ tests ordered today include:  No orders of the defined types were placed in this encounter.   Recommend 150 minutes/week of aerobic exercise Low fat, low carb, high fiber diet recommended  Disposition:   FU in 1 year   Signed, Lance Muss, MD  08/11/2020 8:46 AM    [facility] [address] [place], [place], [place]  [address] Phone: [phone]; Fax: [phone]

## 2020-08-11 ENCOUNTER — Encounter: Payer: Self-pay | Admitting: Interventional Cardiology

## 2020-08-11 ENCOUNTER — Ambulatory Visit: Payer: Medicare Other | Admitting: Interventional Cardiology

## 2020-08-11 ENCOUNTER — Other Ambulatory Visit: Payer: Self-pay

## 2020-08-11 VITALS — BP 128/78 | HR 69 | Ht 66.0 in | Wt 156.0 lb

## 2020-08-11 DIAGNOSIS — Z7901 Long term (current) use of anticoagulants: Secondary | ICD-10-CM | POA: Diagnosis not present

## 2020-08-11 DIAGNOSIS — I4811 Longstanding persistent atrial fibrillation: Secondary | ICD-10-CM

## 2020-08-11 DIAGNOSIS — I359 Nonrheumatic aortic valve disorder, unspecified: Secondary | ICD-10-CM

## 2020-08-11 NOTE — Patient Instructions (Signed)

## 2020-08-15 DIAGNOSIS — Z7901 Long term (current) use of anticoagulants: Secondary | ICD-10-CM | POA: Diagnosis not present

## 2020-10-06 DIAGNOSIS — I700 Atherosclerosis of aorta: Secondary | ICD-10-CM | POA: Diagnosis not present

## 2020-10-06 DIAGNOSIS — I351 Nonrheumatic aortic (valve) insufficiency: Secondary | ICD-10-CM | POA: Diagnosis not present

## 2020-10-06 DIAGNOSIS — Z0000 Encounter for general adult medical examination without abnormal findings: Secondary | ICD-10-CM | POA: Diagnosis not present

## 2020-10-06 DIAGNOSIS — Z7901 Long term (current) use of anticoagulants: Secondary | ICD-10-CM | POA: Diagnosis not present

## 2020-10-06 DIAGNOSIS — R051 Acute cough: Secondary | ICD-10-CM | POA: Diagnosis not present

## 2020-10-06 DIAGNOSIS — D6869 Other thrombophilia: Secondary | ICD-10-CM | POA: Diagnosis not present

## 2020-10-06 DIAGNOSIS — R7303 Prediabetes: Secondary | ICD-10-CM | POA: Diagnosis not present

## 2020-10-06 DIAGNOSIS — Z79899 Other long term (current) drug therapy: Secondary | ICD-10-CM | POA: Diagnosis not present

## 2020-10-06 DIAGNOSIS — E785 Hyperlipidemia, unspecified: Secondary | ICD-10-CM | POA: Diagnosis not present

## 2020-10-06 DIAGNOSIS — Z23 Encounter for immunization: Secondary | ICD-10-CM | POA: Diagnosis not present

## 2020-10-06 DIAGNOSIS — E559 Vitamin D deficiency, unspecified: Secondary | ICD-10-CM | POA: Diagnosis not present

## 2020-10-14 DIAGNOSIS — Z7901 Long term (current) use of anticoagulants: Secondary | ICD-10-CM | POA: Diagnosis not present

## 2020-12-02 DIAGNOSIS — Z7901 Long term (current) use of anticoagulants: Secondary | ICD-10-CM | POA: Diagnosis not present

## 2020-12-17 DIAGNOSIS — Z7901 Long term (current) use of anticoagulants: Secondary | ICD-10-CM | POA: Diagnosis not present

## 2021-01-01 DIAGNOSIS — Z7901 Long term (current) use of anticoagulants: Secondary | ICD-10-CM | POA: Diagnosis not present

## 2021-02-03 DIAGNOSIS — Z7901 Long term (current) use of anticoagulants: Secondary | ICD-10-CM | POA: Diagnosis not present

## 2021-03-06 DIAGNOSIS — Z7901 Long term (current) use of anticoagulants: Secondary | ICD-10-CM | POA: Diagnosis not present

## 2021-03-20 DIAGNOSIS — Z7901 Long term (current) use of anticoagulants: Secondary | ICD-10-CM | POA: Diagnosis not present

## 2021-04-22 DIAGNOSIS — Z7901 Long term (current) use of anticoagulants: Secondary | ICD-10-CM | POA: Diagnosis not present

## 2021-06-01 DIAGNOSIS — Z7901 Long term (current) use of anticoagulants: Secondary | ICD-10-CM | POA: Diagnosis not present

## 2021-06-18 ENCOUNTER — Encounter: Payer: Self-pay | Admitting: Gastroenterology

## 2021-07-09 DIAGNOSIS — Z7901 Long term (current) use of anticoagulants: Secondary | ICD-10-CM | POA: Diagnosis not present

## 2021-07-28 DIAGNOSIS — I10 Essential (primary) hypertension: Secondary | ICD-10-CM | POA: Diagnosis not present

## 2021-07-28 DIAGNOSIS — K219 Gastro-esophageal reflux disease without esophagitis: Secondary | ICD-10-CM | POA: Diagnosis not present

## 2021-07-28 DIAGNOSIS — E559 Vitamin D deficiency, unspecified: Secondary | ICD-10-CM | POA: Diagnosis not present

## 2021-07-28 DIAGNOSIS — Z79899 Other long term (current) drug therapy: Secondary | ICD-10-CM | POA: Diagnosis not present

## 2021-07-28 DIAGNOSIS — I4811 Longstanding persistent atrial fibrillation: Secondary | ICD-10-CM | POA: Diagnosis not present

## 2021-07-28 DIAGNOSIS — E785 Hyperlipidemia, unspecified: Secondary | ICD-10-CM | POA: Diagnosis not present

## 2021-07-28 DIAGNOSIS — Z0000 Encounter for general adult medical examination without abnormal findings: Secondary | ICD-10-CM | POA: Diagnosis not present

## 2021-07-28 DIAGNOSIS — I340 Nonrheumatic mitral (valve) insufficiency: Secondary | ICD-10-CM | POA: Diagnosis not present

## 2021-07-28 DIAGNOSIS — D692 Other nonthrombocytopenic purpura: Secondary | ICD-10-CM | POA: Diagnosis not present

## 2021-07-28 DIAGNOSIS — R7303 Prediabetes: Secondary | ICD-10-CM | POA: Diagnosis not present

## 2021-07-28 DIAGNOSIS — I351 Nonrheumatic aortic (valve) insufficiency: Secondary | ICD-10-CM | POA: Diagnosis not present

## 2021-07-28 DIAGNOSIS — I700 Atherosclerosis of aorta: Secondary | ICD-10-CM | POA: Diagnosis not present

## 2021-07-29 ENCOUNTER — Telehealth: Payer: Self-pay | Admitting: Interventional Cardiology

## 2021-07-29 NOTE — Telephone Encounter (Signed)
   Pre-operative Risk Assessment    Patient Name: Marie [name]  DOB: [birth year] MRN: [id number]     Request for Surgical Clearance    Procedure:  Dental Extraction - Amount of Teeth to be Pulled:  4  Date of Surgery:  Clearance TBD                                 Surgeon:  Dr. Addison Lank Surgeon's Group or Practice Name:  Dr. Dutch Quint (Private Practice)  Phone number:  [phone] Fax number:  [phone]   Type of Clearance Requested:   - Medical  - Pharmacy:  Hold Warfarin (Coumadin)     Type of Anesthesia:  Local    Additional requests/questions:    Signed, April Henson   07/29/2021, 3:22 PM

## 2021-07-29 NOTE — Telephone Encounter (Signed)
Dr. Dutch Quint, coumadin is managed by primary care.  Patient has an appointment with Dr. Eldridge Dace on 08/18/21. If sooner clearance is needed, please notify us so that we can complete a virtual visit to provide clearance for this procedure.  Levi Aland, NP-C    07/29/2021, 4:24 PM [facility] [address] Office [phone] Fax [phone]

## 2021-08-17 NOTE — Progress Notes (Unsigned)
Cardiology Office Note   Date:  08/18/2021   ID:  Fern, Canova 1940/12/10, MRN [id number]  PCP:  Mila Palmer, MD    No chief complaint on file.  AFib  Wt Readings from Last 3 Encounters:  08/18/21 152 lb (68.9 kg)  08/11/20 156 lb (70.8 kg)  07/26/19 154 lb (69.9 kg)       History of Present Illness: Marie [name] is a 81 y.o. female   With chronic atrial fibrillation.  She has used warfarin for stroke prevention for many years.   Had a fall in 2018.  INR has been well controled.  She feels better on 4 mg of warfarin compared to 5 mg.  She has stopped eating greens.     She fell again in 11/18 and broke some toes, slipping out of the bathtub.  She has a procedure with Dr. Addison Lank scheduled and needs clearance.    Denies : Chest pain. Dizziness. Leg edema. Nitroglycerin use. Orthopnea. Palpitations. Paroxysmal nocturnal dyspnea. Shortness of breath. Syncope.      Past Medical History:  Diagnosis Date   Allergy    Atrial fibrillation    Esophageal stricture    GERD (gastroesophageal reflux disease)    MVP (mitral valve prolapse)    Osteopenia    Pre-diabetes    Vitamin D deficiency     Past Surgical History:  Procedure Laterality Date   ABDOMINAL HYSTERECTOMY     1984   TONSILLECTOMY AND ADENOIDECTOMY       Current Outpatient Medications  Medication Sig Dispense Refill   acetaminophen (TYLENOL) 500 MG tablet Take 500 mg by mouth every 6 (six) hours as needed for moderate pain.     atenolol (TENORMIN) 25 MG tablet Take 50 mg by mouth every evening.      cholecalciferol (VITAMIN D) 1000 UNITS tablet Take 1,000 Units by mouth daily.     diclofenac Sodium (VOLTAREN ARTHRITIS PAIN) 1 % GEL as needed. Arthritis pain     Multiple Vitamins-Minerals (CENTRUM SILVER PO) Take 1 tablet by mouth daily.      warfarin (COUMADIN) 4 MG tablet Take 4 mg by mouth every evening. M-F 1 tab; Sat-Sun 1.5 tabs     No current facility-administered medications for  this visit.    Allergies:   Sulfa drugs cross reactors, Adacel [tetanus-diphth-acell pertussis], Tetanus toxoids, Tetanus immune globulin, and Vicodin [hydrocodone-acetaminophen]    Social History:  The patient  reports that she has never smoked. She has never used smokeless tobacco. She reports that she does not drink alcohol and does not use drugs.   Family History:  The patient's family history includes Atrial fibrillation in her brother and sister; Breast cancer in her mother; Healthy in her brother and sister; Heart disease in her maternal aunt; Heart failure in her father; Lymphoma in her mother.    ROS:  Please see the history of present illness.   Otherwise, review of systems are positive for poor dentition.   All other systems are reviewed and negative.    PHYSICAL EXAM: VS:  BP (!) 140/68   Pulse 64   Ht 5\' 5"  (1.651 m)   Wt 152 lb (68.9 kg)   LMP  (LMP Unknown)   SpO2 97%   BMI 25.29 kg/m  , BMI Body mass index is 25.29 kg/m. GEN: Well nourished, well developed, in no acute distress HEENT: poor dentition Neck: no JVD, carotid bruits, or masses Cardiac: RRR; no murmurs, rubs, or gallops,no  edema  Respiratory:  clear to auscultation bilaterally, normal work of breathing GI: soft, nontender, nondistended, + BS MS: no deformity or atrophy Skin: warm and dry, no rash Neuro:  Strength and sensation are intact Psych: euthymic mood, full affect   EKG:   The ekg ordered today demonstrates NSR, no ST segment   Recent Labs: No results found for requested labs within last 365 days.   Lipid Panel No results found for: "CHOL", "TRIG", "HDL", "CHOLHDL", "VLDL", "LDLCALC", "LDLDIRECT"   Other studies Reviewed: Additional studies/ records that were reviewed today with results demonstrating: Prior echocardiogram reviewed.   ASSESSMENT AND PLAN:  Atrial fibrillation: Rate control strategy.  ECG shows that she is well rate controlled. Aortic valve calcification: No signs  of severe aortic stenosis.  The patient has been hesitant to get a repeat echocardiogram.  Clinically, the valve does not appear to be severely stenosed. Anticoagulated: Coumadin for stroke prevention. Preoperative cardiovascular examination: I did recommend an echocardiogram for a longstanding murmur.  I suspect she has some degree of aortic stenosis but not likely to be severe given her lack of symptoms and exam.  I have recommended the echocardiogram in the past.  She is agreeable today.  We will also check with our Pharm.D. regarding anticoagulation protocol baesd on her risk factors.     Current medicines are reviewed at length with the patient today.  The patient concerns regarding her medicines were addressed.  The following changes have been made:  No change  Labs/ tests ordered today include: echo No orders of the defined types were placed in this encounter.   Recommend 150 minutes/week of aerobic exercise Low fat, low carb, high fiber diet recommended  Disposition:   FU in 1 year   Signed, Lance Muss, MD  08/18/2021 2:35 PM    [facility] [address] [place], [place], [place]  [address] Phone: [phone]; Fax: [phone]

## 2021-08-18 ENCOUNTER — Ambulatory Visit: Payer: Medicare Other | Admitting: Interventional Cardiology

## 2021-08-18 ENCOUNTER — Encounter: Payer: Self-pay | Admitting: Interventional Cardiology

## 2021-08-18 VITALS — BP 140/68 | HR 64 | Ht 65.0 in | Wt 152.0 lb

## 2021-08-18 DIAGNOSIS — R011 Cardiac murmur, unspecified: Secondary | ICD-10-CM | POA: Diagnosis not present

## 2021-08-18 DIAGNOSIS — Z7901 Long term (current) use of anticoagulants: Secondary | ICD-10-CM | POA: Diagnosis not present

## 2021-08-18 DIAGNOSIS — I4811 Longstanding persistent atrial fibrillation: Secondary | ICD-10-CM

## 2021-08-18 DIAGNOSIS — I359 Nonrheumatic aortic valve disorder, unspecified: Secondary | ICD-10-CM | POA: Diagnosis not present

## 2021-08-18 NOTE — Patient Instructions (Signed)
Medication Instructions:  Your physician recommends that you continue on your current medications as directed. Please refer to the Current Medication list given to you today.  *If you need a refill on your cardiac medications before your next appointment, please call your pharmacy*   Lab Work: none If you have labs (blood work) drawn today and your tests are completely normal, you will receive your results only by: MyChart Message (if you have MyChart) OR A paper copy in the mail If you have any lab test that is abnormal or we need to change your treatment, we will call you to review the results.   Testing/Procedures: Your physician has requested that you have an echocardiogram. Echocardiography is a painless test that uses sound waves to create images of your heart. It provides your doctor with information about the size and shape of your heart and how well your heart's chambers and valves are working. This procedure takes approximately one hour. There are no restrictions for this procedure.    Follow-Up: At [facility], you and your health needs are our priority.  As part of our continuing mission to provide you with exceptional heart care, we have created designated Provider Care Teams.  These Care Teams include your primary Cardiologist (physician) and Advanced Practice Providers (APPs -  Physician Assistants and Nurse Practitioners) who all work together to provide you with the care you need, when you need it.  We recommend signing up for the patient portal called "MyChart".  Sign up information is provided on this After Visit Summary.  MyChart is used to connect with patients for Virtual Visits (Telemedicine).  Patients are able to view lab/test results, encounter notes, upcoming appointments, etc.  Non-urgent messages can be sent to your provider as well.   To learn more about what you can do with MyChart, go to [email or web address].com.    Your next appointment:   12  month(s)  The format for your next appointment:   In Person  Provider:   Jayadeep Varanasi, MD     Other Instructions    Important Information About Sugar       

## 2021-08-25 NOTE — Progress Notes (Signed)
She is also awaiting echo which has not been scheduled

## 2021-08-26 NOTE — Telephone Encounter (Signed)
   Primary Cardiologist: Lance Muss, MD  Chart reviewed as part of pre-operative protocol coverage. Given past medical history and time since last visit, based on ACC/AHA guidelines, Marie [name] would be at acceptable risk for the planned procedure without further cardiovascular testing.  Per Dr. Eldridge Dace who saw the patient in the office on 08/18/21: I don't know if a date has been set for her dental procedure, but looks like she can hold COumadin 5 days prior without Lovenox bridge from our standpoint.  SHe should also check with her PMD.    I will route this recommendation to the requesting party via Epic fax function and remove from pre-op pool.  Please call with questions.  Levi Aland, NP-C    08/26/2021, 1:52 PM [facility] [address] Office [phone] Fax [phone]

## 2021-08-28 NOTE — Progress Notes (Signed)
CLEARANCE NOTES HAVE BEEN FAXED TO REQUESTING OFFICE  

## 2021-09-01 DIAGNOSIS — Z7901 Long term (current) use of anticoagulants: Secondary | ICD-10-CM | POA: Diagnosis not present

## 2021-09-04 ENCOUNTER — Ambulatory Visit (HOSPITAL_COMMUNITY): Payer: Medicare Other | Attending: Cardiology

## 2021-09-04 DIAGNOSIS — R011 Cardiac murmur, unspecified: Secondary | ICD-10-CM | POA: Diagnosis not present

## 2021-09-05 LAB — ECHOCARDIOGRAM COMPLETE
AR max vel: 1.06 cm2
AV Area VTI: 1.08 cm2
AV Area mean vel: 1.01 cm2
AV Mean grad: 23 mmHg
AV Peak grad: 36.3 mmHg
Ao pk vel: 3.01 m/s
Area-P 1/2: 3.85 cm2
MV M vel: 5.19 m/s
MV Peak grad: 107.7 mmHg
Radius: 0.6 cm
S' Lateral: 2.2 cm

## 2021-09-23 ENCOUNTER — Telehealth: Payer: Self-pay | Admitting: Interventional Cardiology

## 2021-09-23 NOTE — Telephone Encounter (Signed)
Patient called to follow-up the clearance for her dental procedure.  Patient stated Dr. Dutch Quint would like speak with Dr. Eldridge Dace regarding this procedure.

## 2021-09-23 NOTE — Telephone Encounter (Signed)
Called Dr. Ethelene Browns office and spoke with person to offer assistance of sending question to Dr. Eldridge Dace if needed. She states they have the information from our office and there are no further questions. She thanked me for the call.

## 2021-09-29 DIAGNOSIS — Z7901 Long term (current) use of anticoagulants: Secondary | ICD-10-CM | POA: Diagnosis not present

## 2021-11-30 DIAGNOSIS — Z7901 Long term (current) use of anticoagulants: Secondary | ICD-10-CM | POA: Diagnosis not present

## 2021-11-30 DIAGNOSIS — Z23 Encounter for immunization: Secondary | ICD-10-CM | POA: Diagnosis not present

## 2021-12-16 DIAGNOSIS — Z7901 Long term (current) use of anticoagulants: Secondary | ICD-10-CM | POA: Diagnosis not present

## 2022-02-03 DIAGNOSIS — Z7901 Long term (current) use of anticoagulants: Secondary | ICD-10-CM | POA: Diagnosis not present

## 2022-02-24 DIAGNOSIS — Z7901 Long term (current) use of anticoagulants: Secondary | ICD-10-CM | POA: Diagnosis not present

## 2022-04-07 DIAGNOSIS — Z7901 Long term (current) use of anticoagulants: Secondary | ICD-10-CM | POA: Diagnosis not present

## 2022-05-24 ENCOUNTER — Telehealth: Payer: Self-pay | Admitting: Interventional Cardiology

## 2022-05-24 DIAGNOSIS — Z7901 Long term (current) use of anticoagulants: Secondary | ICD-10-CM | POA: Diagnosis not present

## 2022-05-24 NOTE — Telephone Encounter (Signed)
   [facility] Pre-operative Risk Assessment    Request for surgical clearance:  What type of surgery is being performed?  5 Dental Extractions   When is this surgery scheduled?  TBD   What type of clearance is required (medical clearance vs. Pharmacy clearance to hold med vs. Both)?  Both  Are there any medications that need to be held prior to surgery and how long? Their office would like to know if the patient needs to hold her blood thinner   Practice name and name of physician performing surgery?  Dr. Trin[facility]  Dr. Addison Lank   What is your office phone number? [phone]    7.   What is your office fax number? [phone]  8.   Anesthesia type (None, local, MAC, general)?  Local    Rolly Pancake 05/24/2022, 3:34 PM

## 2022-05-25 ENCOUNTER — Telehealth: Payer: Self-pay | Admitting: *Deleted

## 2022-05-25 NOTE — Telephone Encounter (Signed)
Patient with diagnosis of afib on warfarin for anticoagulation.    Procedure: 5 dental extractions Date of procedure: TBD  CHA2DS2-VASc Score = 3  This indicates a 3.2% annual risk of stroke. The patient's score is based upon: CHF History: 0 HTN History: 0 Diabetes History: 0 Stroke History: 0 Vascular Disease History: 0 Age Score: 2 Gender Score: 1  CrCl 22mL/min Platelet count 228K  Patient does not require pre-op antibiotics for dental procedure.  Per office protocol, patient can hold warfarin for 5 days prior to procedure. Patient will not need bridging with Lovenox (enoxaparin) around procedure. Her PCP manages her INRs.  **This guidance is not considered finalized until pre-operative APP has relayed final recommendations.**

## 2022-05-25 NOTE — Telephone Encounter (Signed)
1st attempt to reach pt regarding surgical clearance and the need for a tele visit.  Left a message for pt to call back and ask for the preop team. 

## 2022-05-25 NOTE — Telephone Encounter (Signed)
Patient returned Pre-op call. 

## 2022-05-25 NOTE — Telephone Encounter (Signed)
   Name: Marie [name]  DOB: 07/04/1940  MRN: [id number]  Primary Cardiologist: Lance Muss, MD   Preoperative team, please contact this patient and set up a phone call appointment for further preoperative risk assessment. Please obtain consent and complete medication review. Thank you for your help.  I confirm that guidance regarding antiplatelet and oral anticoagulation therapy has been completed and, if necessary, noted below. Per Pharm D: Patient with diagnosis of afib on warfarin for anticoagulation.     Procedure: 5 dental extractions Date of procedure: TBD   CHA2DS2-VASc Score = 3  This indicates a 3.2% annual risk of stroke. The patient's score is based upon: CHF History: 0 HTN History: 0 Diabetes History: 0 Stroke History: 0 Vascular Disease History: 0 Age Score: 2 Gender Score: 1   CrCl 60mL/min Platelet count 228K   Patient does not require pre-op antibiotics for dental procedure.   Per office protocol, patient can hold warfarin for 5 days prior to procedure. Patient will not need bridging with Lovenox (enoxaparin) around procedure. Her PCP manages her INRs.     Carlos Levering, NP 05/25/2022, 11:20 AM [facility]

## 2022-05-25 NOTE — Telephone Encounter (Signed)
Pt has been scheduled for a tele visit 05/27/22 2:40.  Consent on file / medications reconciled.    Patient Consent for Virtual Visit        Marie [name] has provided verbal consent on 05/25/2022 for a virtual visit (video or telephone).   CONSENT FOR VIRTUAL VISIT FOR:  Marie [name]  By participating in this virtual visit I agree to the following:  I hereby voluntarily request, consent and authorize [facility] and its employed or contracted physicians, physician assistants, nurse practitioners or other licensed health care professionals (the Practitioner), to provide me with telemedicine health care services (the "Services") as deemed necessary by the treating Practitioner. I acknowledge and consent to receive the Services by the Practitioner via telemedicine. I understand that the telemedicine visit will involve communicating with the Practitioner through live audiovisual communication technology and the disclosure of certain medical information by electronic transmission. I acknowledge that I have been given the opportunity to request an in-person assessment or other available alternative prior to the telemedicine visit and am voluntarily participating in the telemedicine visit.  I understand that I have the right to withhold or withdraw my consent to the use of telemedicine in the course of my care at any time, without affecting my right to future care or treatment, and that the Practitioner or I may terminate the telemedicine visit at any time. I understand that I have the right to inspect all information obtained and/or recorded in the course of the telemedicine visit and may receive copies of available information for a reasonable fee.  I understand that some of the potential risks of receiving the Services via telemedicine include:  Delay or interruption in medical evaluation due to technological equipment failure or disruption; Information transmitted may not be sufficient (e.g.  poor resolution of images) to allow for appropriate medical decision making by the Practitioner; and/or  In rare instances, security protocols could fail, causing a breach of personal health information.  Furthermore, I acknowledge that it is my responsibility to provide information about my medical history, conditions and care that is complete and accurate to the best of my ability. I acknowledge that Practitioner's advice, recommendations, and/or decision may be based on factors not within their control, such as incomplete or inaccurate data provided by me or distortions of diagnostic images or specimens that may result from electronic transmissions. I understand that the practice of medicine is not an exact science and that Practitioner makes no warranties or guarantees regarding treatment outcomes. I acknowledge that a copy of this consent can be made available to me via my patient portal [facility] MyChart), or I can request a printed copy by calling the office of [facility].    I understand that my insurance will be billed for this visit.   I have read or had this consent read to me. I understand the contents of this consent, which adequately explains the benefits and risks of the Services being provided via telemedicine.  I have been provided ample opportunity to ask questions regarding this consent and the Services and have had my questions answered to my satisfaction. I give my informed consent for the services to be provided through the use of telemedicine in my medical care

## 2022-05-25 NOTE — Telephone Encounter (Signed)
Pt has been scheduled for a tele visit 05/27/22 2:40.  Consent on file / medications reconciled.

## 2022-05-27 ENCOUNTER — Ambulatory Visit: Payer: Medicare Other | Attending: Internal Medicine

## 2022-05-27 DIAGNOSIS — Z01810 Encounter for preprocedural cardiovascular examination: Secondary | ICD-10-CM

## 2022-05-27 NOTE — Progress Notes (Signed)
Virtual Visit via Telephone Note   Because of Marie [name]'s co-morbid illnesses, she is at least at moderate risk for complications without adequate follow up.  This format is felt to be most appropriate for this patient at this time.  The patient did not have access to video technology/had technical difficulties with video requiring transitioning to audio format only (telephone).  All issues noted in this document were discussed and addressed.  No physical exam could be performed with this format.  Please refer to the patient's chart for her consent to telehealth for [facility].  Evaluation Performed:  Preoperative cardiovascular risk assessment _____________   Date:  05/27/2022   Patient ID:  Marie, [name] [id number], MRN [id number] Patient Location:  Home Provider location:   Office  Primary Care Provider:  Mila Palmer, MD Primary Cardiologist:  Lance Muss, MD  Chief Complaint / Patient Profile   82 y.o. y/o female with a h/o chronic atrial fibrillation (on Coumadin for stroke prevention for many years), moderate AS, Previous falls, mitral valve prolapse who is pending 5 dental extractions and presents today for telephonic preoperative cardiovascular risk assessment.  History of Present Illness    Marie [name] is a 82 y.o. female who presents via audio/video conferencing for a telehealth visit today.  Pt was last seen in cardiology clinic on 08/18/2021 by Dr. Eldridge Dace.  At that time Marie [name] was doing well .  The patient is now pending procedure as outlined above. Since her last visit, she tells me that she walks to the mailbox and back which involves a hill.  She lives in an apartment complex that does not have any outdoor yard work to worry about.  She loves watching golf but does not play.  She has scored higher than a 4 METS on the DASI.  She currently does not have a date for her extractions.  Echo from August of last year reviewed with moderate  aortic stenosis.  Patient asymptomatic at this time.  No recent labs to review.   Per office protocol, patient can hold warfarin for 5 days prior to procedure. Patient will not need bridging with Lovenox (enoxaparin) around procedure. Her PCP manages her INRs.    No need for SBE prophylaxis.   Past Medical History    Past Medical History:  Diagnosis Date   Allergy    Atrial fibrillation    Esophageal stricture    GERD (gastroesophageal reflux disease)    MVP (mitral valve prolapse)    Osteopenia    Pre-diabetes    Vitamin D deficiency    Past Surgical History:  Procedure Laterality Date   ABDOMINAL HYSTERECTOMY     1984   TONSILLECTOMY AND ADENOIDECTOMY      Allergies  Allergies  Allergen Reactions   Sulfa Drugs Cross Reactors Other (See Comments)    REACTION: "PASSED OUT"   Adacel [Tetanus-Diphth-Acell Pertussis] Rash   Tetanus Toxoids Rash   Tetanus Immune Globulin     Other reaction(s): rash   Vicodin [Hydrocodone-Acetaminophen] Other (See Comments)    REACTION: "NOT SURE"    Home Medications    Prior to Admission medications   Medication Sig Start Date End Date Taking? Authorizing Provider  acetaminophen (TYLENOL) 500 MG tablet Take 500 mg by mouth every 6 (six) hours as needed for moderate pain.    [provider]  atenolol (TENORMIN) 25 MG tablet Take 50 mg by mouth every evening.     [provider]  diclofenac Sodium (VOLTAREN ARTHRITIS PAIN) 1 % GEL as needed. Arthritis pain    [provider]  Multiple Vitamins-Minerals (CENTRUM SILVER PO) Take 1 tablet by mouth daily.     [provider]  warfarin (COUMADIN) 4 MG tablet Take 4 mg by mouth every evening. M-F 1 tab; Sat-Sun 1.5 tabs    [provider]    Physical Exam    Vital Signs:  Marie [name] does not have vital signs available for review today.  Given telephonic nature of communication, physical exam is limited. AAOx3. NAD. Normal affect.  Speech  and respirations are unlabored.  Accessory Clinical Findings    None  Assessment & Plan    1.  Preoperative Cardiovascular Risk Assessment:  Marie [name] perioperative risk of a major cardiac event is 0.4% according to the Revised Cardiac Risk Index (RCRI).  Therefore, she is at low risk for perioperative complications.   Her functional capacity is good at 5.07 METs according to the [employer] Activity Status Index (DASI). Recommendations: According to ACC/AHA guidelines, no further cardiovascular testing needed.  The patient may proceed to surgery at acceptable risk.   Antiplatelet and/or Anticoagulation Recommendations:  Coumadin can by held for 5 days prior to surgery.  Please resume post op when felt to be safe.   A copy of this note will be routed to requesting surgeon.  Time:   Today, I have spent 7 minutes with the patient with telehealth technology discussing medical history, symptoms, and management plan.     Sharlene Dory, PA-C  05/27/2022, 2:42 PM

## 2022-06-04 ENCOUNTER — Telehealth: Payer: Self-pay | Admitting: Interventional Cardiology

## 2022-06-04 NOTE — Telephone Encounter (Signed)
See clearance notes  

## 2022-06-04 NOTE — Telephone Encounter (Signed)
Follow Up:     Patient is calling to check on the status of her clearance. She says her dentist is waiting for her clearance.

## 2022-06-04 NOTE — Telephone Encounter (Signed)
I s/w the pt and did tell her that Jari Favre, [facility] did fax clearance notes over on 05/27/22. I did also let her know that she can hold her Coumadin x 5 days prior to dental procedure. I assured the pt that I will re-fax notes from Jari Favre, [facility] to dental office.

## 2022-06-17 ENCOUNTER — Telehealth: Payer: Self-pay | Admitting: Interventional Cardiology

## 2022-06-17 NOTE — Telephone Encounter (Signed)
Faxed clearance over again and called Lance Bosch and confirmed that she did receive it.

## 2022-06-17 NOTE — Telephone Encounter (Signed)
Lance Bosch with Dr. Trinna Post [facility]'s office is following up. She states clearance still was not received. She would like to know if it can be faxed and e-mailed if possible.  Phone#: [phone] Fax#: [phone] E-mail: [email or web address]

## 2022-07-22 ENCOUNTER — Encounter (HOSPITAL_COMMUNITY): Payer: Self-pay | Admitting: Internal Medicine

## 2022-07-22 ENCOUNTER — Emergency Department (HOSPITAL_COMMUNITY): Payer: Medicare Other

## 2022-07-22 ENCOUNTER — Other Ambulatory Visit: Payer: Self-pay

## 2022-07-22 ENCOUNTER — Inpatient Hospital Stay (HOSPITAL_COMMUNITY)
Admission: EM | Admit: 2022-07-22 | Discharge: 2022-08-19 | DRG: 065 | Disposition: E | Payer: Medicare Other | Attending: Internal Medicine | Admitting: Internal Medicine

## 2022-07-22 ENCOUNTER — Encounter: Payer: Self-pay | Admitting: Internal Medicine

## 2022-07-22 ENCOUNTER — Inpatient Hospital Stay (HOSPITAL_COMMUNITY): Payer: Medicare Other

## 2022-07-22 DIAGNOSIS — I482 Chronic atrial fibrillation, unspecified: Secondary | ICD-10-CM | POA: Diagnosis present

## 2022-07-22 DIAGNOSIS — R4781 Slurred speech: Secondary | ICD-10-CM | POA: Diagnosis present

## 2022-07-22 DIAGNOSIS — R0602 Shortness of breath: Secondary | ICD-10-CM | POA: Diagnosis not present

## 2022-07-22 DIAGNOSIS — R471 Dysarthria and anarthria: Secondary | ICD-10-CM | POA: Diagnosis not present

## 2022-07-22 DIAGNOSIS — G8114 Spastic hemiplegia affecting left nondominant side: Secondary | ICD-10-CM | POA: Diagnosis not present

## 2022-07-22 DIAGNOSIS — I6389 Other cerebral infarction: Secondary | ICD-10-CM | POA: Diagnosis not present

## 2022-07-22 DIAGNOSIS — B370 Candidal stomatitis: Secondary | ICD-10-CM | POA: Diagnosis not present

## 2022-07-22 DIAGNOSIS — Z515 Encounter for palliative care: Secondary | ICD-10-CM | POA: Diagnosis not present

## 2022-07-22 DIAGNOSIS — R7303 Prediabetes: Secondary | ICD-10-CM | POA: Diagnosis not present

## 2022-07-22 DIAGNOSIS — Z7901 Long term (current) use of anticoagulants: Secondary | ICD-10-CM | POA: Diagnosis not present

## 2022-07-22 DIAGNOSIS — R1312 Dysphagia, oropharyngeal phase: Secondary | ICD-10-CM | POA: Diagnosis not present

## 2022-07-22 DIAGNOSIS — G911 Obstructive hydrocephalus: Secondary | ICD-10-CM | POA: Diagnosis not present

## 2022-07-22 DIAGNOSIS — R29810 Facial weakness: Secondary | ICD-10-CM | POA: Diagnosis present

## 2022-07-22 DIAGNOSIS — I639 Cerebral infarction, unspecified: Secondary | ICD-10-CM | POA: Diagnosis not present

## 2022-07-22 DIAGNOSIS — G8194 Hemiplegia, unspecified affecting left nondominant side: Secondary | ICD-10-CM | POA: Diagnosis present

## 2022-07-22 DIAGNOSIS — I63511 Cerebral infarction due to unspecified occlusion or stenosis of right middle cerebral artery: Secondary | ICD-10-CM | POA: Diagnosis not present

## 2022-07-22 DIAGNOSIS — R29720 NIHSS score 20: Secondary | ICD-10-CM | POA: Diagnosis not present

## 2022-07-22 DIAGNOSIS — R140 Abdominal distension (gaseous): Secondary | ICD-10-CM | POA: Diagnosis not present

## 2022-07-22 DIAGNOSIS — R112 Nausea with vomiting, unspecified: Secondary | ICD-10-CM | POA: Diagnosis not present

## 2022-07-22 DIAGNOSIS — I63411 Cerebral infarction due to embolism of right middle cerebral artery: Principal | ICD-10-CM | POA: Diagnosis present

## 2022-07-22 DIAGNOSIS — I4891 Unspecified atrial fibrillation: Secondary | ICD-10-CM | POA: Diagnosis not present

## 2022-07-22 DIAGNOSIS — G8190 Hemiplegia, unspecified affecting unspecified side: Secondary | ICD-10-CM | POA: Diagnosis not present

## 2022-07-22 DIAGNOSIS — I6523 Occlusion and stenosis of bilateral carotid arteries: Secondary | ICD-10-CM | POA: Diagnosis not present

## 2022-07-22 DIAGNOSIS — Z66 Do not resuscitate: Secondary | ICD-10-CM | POA: Diagnosis not present

## 2022-07-22 DIAGNOSIS — E785 Hyperlipidemia, unspecified: Secondary | ICD-10-CM | POA: Diagnosis present

## 2022-07-22 DIAGNOSIS — E119 Type 2 diabetes mellitus without complications: Secondary | ICD-10-CM | POA: Diagnosis not present

## 2022-07-22 DIAGNOSIS — G4489 Other headache syndrome: Secondary | ICD-10-CM | POA: Diagnosis not present

## 2022-07-22 DIAGNOSIS — Z4682 Encounter for fitting and adjustment of non-vascular catheter: Secondary | ICD-10-CM | POA: Diagnosis not present

## 2022-07-22 DIAGNOSIS — I10 Essential (primary) hypertension: Secondary | ICD-10-CM | POA: Diagnosis not present

## 2022-07-22 DIAGNOSIS — R918 Other nonspecific abnormal finding of lung field: Secondary | ICD-10-CM | POA: Diagnosis not present

## 2022-07-22 DIAGNOSIS — E440 Moderate protein-calorie malnutrition: Secondary | ICD-10-CM | POA: Insufficient documentation

## 2022-07-22 DIAGNOSIS — I517 Cardiomegaly: Secondary | ICD-10-CM | POA: Diagnosis not present

## 2022-07-22 DIAGNOSIS — I69391 Dysphagia following cerebral infarction: Secondary | ICD-10-CM | POA: Diagnosis not present

## 2022-07-22 DIAGNOSIS — R531 Weakness: Secondary | ICD-10-CM | POA: Diagnosis not present

## 2022-07-22 DIAGNOSIS — R6889 Other general symptoms and signs: Secondary | ICD-10-CM | POA: Diagnosis not present

## 2022-07-22 DIAGNOSIS — Z743 Need for continuous supervision: Secondary | ICD-10-CM | POA: Diagnosis not present

## 2022-07-22 DIAGNOSIS — I6782 Cerebral ischemia: Secondary | ICD-10-CM | POA: Diagnosis not present

## 2022-07-22 DIAGNOSIS — Z7189 Other specified counseling: Secondary | ICD-10-CM | POA: Diagnosis not present

## 2022-07-22 HISTORY — DX: Prediabetes: R73.03

## 2022-07-22 HISTORY — DX: Unspecified atrial fibrillation: I48.91

## 2022-07-22 LAB — COMPREHENSIVE METABOLIC PANEL
ALT: 23 U/L (ref 0–44)
Alkaline Phosphatase: 109 U/L (ref 38–126)
BUN: 14 mg/dL (ref 8–23)
CO2: 20 mmol/L — ABNORMAL LOW (ref 22–32)
Calcium: 8.8 mg/dL — ABNORMAL LOW (ref 8.9–10.3)
Chloride: 102 mmol/L (ref 98–111)
Creatinine, Ser: 0.83 mg/dL (ref 0.44–1.00)
GFR, Estimated: >60 mL/min (ref >60)
Glucose, Bld: 147 mg/dL — ABNORMAL HIGH (ref 70–99)
Potassium: 4 mmol/L (ref 3.5–5.1)
Sodium: 136 mmol/L (ref 135–145)
Total Bilirubin: 0.9 mg/dL (ref 0.3–1.2)
Total Protein: 7 g/dL (ref 6.5–8.1)

## 2022-07-22 LAB — CBC
HCT: 37 % (ref 36.0–46.0)
MCH: 30.6 pg (ref 26.0–34.0)
MCHC: 33.5 g/dL (ref 30.0–36.0)
RBC: 4.05 MIL/uL (ref 3.87–5.11)
RDW: 13.2 % (ref 11.5–15.5)
nRBC: 0 % (ref 0.0–0.2)

## 2022-07-22 LAB — I-STAT CHEM 8, ED
Chloride: 104 mmol/L (ref 98–111)
Glucose, Bld: 145 mg/dL — ABNORMAL HIGH (ref 70–99)
HCT: 37 % (ref 36.0–46.0)
Hemoglobin: 12.6 g/dL (ref 12.0–15.0)
Potassium: 4.1 mmol/L (ref 3.5–5.1)
Sodium: 137 mmol/L (ref 135–145)

## 2022-07-22 LAB — DIFFERENTIAL
Abs Immature Granulocytes: 0.02 10*3/uL (ref 0.00–0.07)
Basophils Absolute: 0 10*3/uL (ref 0.0–0.1)
Basophils Relative: 1 %
Immature Granulocytes: 0 %
Monocytes Relative: 5 %
Neutro Abs: 5.5 10*3/uL (ref 1.7–7.7)
Neutrophils Relative %: 83 %

## 2022-07-22 LAB — ETHANOL: Alcohol, Ethyl (B): <10 mg/dL (ref <10)

## 2022-07-22 LAB — GLUCOSE, CAPILLARY: Glucose-Capillary: 148 mg/dL — ABNORMAL HIGH (ref 70–99)

## 2022-07-22 LAB — HEMOGLOBIN A1C: Hgb A1c MFr Bld: 6 % — ABNORMAL HIGH (ref 4.8–5.6)

## 2022-07-22 LAB — APTT: aPTT: 31 seconds (ref 24–36)

## 2022-07-22 MED ORDER — ACETAMINOPHEN 160 MG/5ML PO SOLN: 650.0000 mg | ORAL | Status: DC | PRN

## 2022-07-22 MED ORDER — IOHEXOL 350 MG/ML SOLN
100.0000 mL | Freq: Once | INTRAVENOUS | Status: AC | PRN
  Administered 2022-07-22: 100 mL via INTRAVENOUS

## 2022-07-22 MED ORDER — SODIUM CHLORIDE 0.9 % IV SOLN: INTRAVENOUS | Status: DC

## 2022-07-22 MED ORDER — STROKE: EARLY STAGES OF RECOVERY BOOK
Freq: Once | Status: AC
  Filled 2022-07-22: qty 1

## 2022-07-22 MED ORDER — ACETAMINOPHEN 650 MG RE SUPP
650.0000 mg | RECTAL | Status: DC | PRN
  Administered 2022-07-23: 650 mg via RECTAL
  Filled 2022-07-22: qty 1

## 2022-07-22 MED ORDER — ACETAMINOPHEN 325 MG PO TABS
650.0000 mg | ORAL_TABLET | ORAL | Status: DC | PRN
  Administered 2022-07-23: 650 mg via ORAL
  Filled 2022-07-22: qty 2

## 2022-07-22 MED ORDER — INSULIN ASPART 100 UNIT/ML IJ SOLN: 0.0000 [IU] | Freq: Every day | INTRAMUSCULAR | Status: DC

## 2022-07-22 MED ORDER — INSULIN ASPART 100 UNIT/ML IJ SOLN
0.0000 [IU] | Freq: Three times a day (TID) | INTRAMUSCULAR | Status: DC
  Administered 2022-07-22 – 2022-07-24 (×3): 1 [IU] via SUBCUTANEOUS
  Administered 2022-07-24: 2 [IU] via SUBCUTANEOUS
  Administered 2022-07-24: 1 [IU] via SUBCUTANEOUS

## 2022-07-22 MED ORDER — ASPIRIN 325 MG PO TABS
325.0000 mg | ORAL_TABLET | Freq: Every day | ORAL | Status: DC
  Administered 2022-07-23: 325 mg via ORAL
  Filled 2022-07-22: qty 1

## 2022-07-22 MED ORDER — ASPIRIN 300 MG RE SUPP
300.0000 mg | Freq: Every day | RECTAL | Status: DC
  Administered 2022-07-22: 300 mg via RECTAL
  Filled 2022-07-22: qty 1

## 2022-07-23 ENCOUNTER — Other Ambulatory Visit (HOSPITAL_COMMUNITY): Payer: Self-pay

## 2022-07-23 ENCOUNTER — Inpatient Hospital Stay (HOSPITAL_COMMUNITY): Payer: Medicare Other

## 2022-07-23 DIAGNOSIS — Z515 Encounter for palliative care: Secondary | ICD-10-CM

## 2022-07-23 DIAGNOSIS — I69391 Dysphagia following cerebral infarction: Secondary | ICD-10-CM

## 2022-07-23 DIAGNOSIS — I6389 Other cerebral infarction: Secondary | ICD-10-CM

## 2022-07-23 DIAGNOSIS — G8114 Spastic hemiplegia affecting left nondominant side: Secondary | ICD-10-CM

## 2022-07-23 DIAGNOSIS — I639 Cerebral infarction, unspecified: Secondary | ICD-10-CM | POA: Diagnosis not present

## 2022-07-23 DIAGNOSIS — Z7189 Other specified counseling: Secondary | ICD-10-CM

## 2022-07-23 LAB — ECHOCARDIOGRAM COMPLETE
AV Area VTI: 1.05 cm2
AV Mean grad: 19 mmHg
AV Peak grad: 34.8 mmHg
Ao pk vel: 2.95 m/s
Weight: 2553.81 oz

## 2022-07-23 LAB — LIPID PANEL
HDL: 57 mg/dL (ref >40)
LDL Cholesterol: 61 mg/dL (ref 0–99)
VLDL: 11 mg/dL (ref 0–40)

## 2022-07-23 LAB — GLUCOSE, CAPILLARY
Glucose-Capillary: 123 mg/dL — ABNORMAL HIGH (ref 70–99)
Glucose-Capillary: 112 mg/dL — ABNORMAL HIGH (ref 70–99)

## 2022-07-23 MED ORDER — OSMOLITE 1.5 CAL PO LIQD
1000.0000 mL | ORAL | Status: DC
  Administered 2022-07-23 – 2022-07-24 (×2): 1000 mL
  Filled 2022-07-23 (×3): qty 1000

## 2022-07-23 MED ORDER — PANTOPRAZOLE SODIUM 40 MG IV SOLR
40.0000 mg | INTRAVENOUS | Status: DC
  Administered 2022-07-23 – 2022-07-24 (×2): 40 mg via INTRAVENOUS
  Filled 2022-07-23 (×2): qty 10

## 2022-07-23 MED ORDER — FREE WATER
130.0000 mL | Status: DC
  Administered 2022-07-23 – 2022-07-24 (×7): 130 mL

## 2022-07-23 MED ORDER — NYSTATIN 100000 UNIT/ML MT SUSP
5.0000 mL | Freq: Four times a day (QID) | OROMUCOSAL | Status: DC
  Administered 2022-07-23 – 2022-07-24 (×6): 500000 [IU] via ORAL
  Filled 2022-07-23 (×7): qty 5

## 2022-07-23 MED ORDER — PROSOURCE TF20 ENFIT COMPATIBL EN LIQD
60.0000 mL | Freq: Every day | ENTERAL | Status: DC
  Administered 2022-07-23 – 2022-07-24 (×2): 60 mL
  Filled 2022-07-23 (×2): qty 60

## 2022-07-23 MED ORDER — METOPROLOL TARTRATE 5 MG/5ML IV SOLN
5.0000 mg | Freq: Three times a day (TID) | INTRAVENOUS | Status: DC | PRN
  Administered 2022-07-25: 5 mg via INTRAVENOUS
  Filled 2022-07-23: qty 5

## 2022-07-23 MED ORDER — ASPIRIN 300 MG RE SUPP
150.0000 mg | Freq: Every day | RECTAL | Status: DC
  Administered 2022-07-24: 150 mg via RECTAL
  Filled 2022-07-23: qty 1

## 2022-07-24 ENCOUNTER — Inpatient Hospital Stay (HOSPITAL_COMMUNITY): Payer: Medicare Other

## 2022-07-24 DIAGNOSIS — E440 Moderate protein-calorie malnutrition: Secondary | ICD-10-CM | POA: Insufficient documentation

## 2022-07-24 DIAGNOSIS — Z515 Encounter for palliative care: Secondary | ICD-10-CM | POA: Diagnosis not present

## 2022-07-24 DIAGNOSIS — I639 Cerebral infarction, unspecified: Secondary | ICD-10-CM | POA: Diagnosis not present

## 2022-07-24 LAB — CBC WITH DIFFERENTIAL/PLATELET
Abs Immature Granulocytes: 0.03 10*3/uL (ref 0.00–0.07)
Basophils Absolute: 0 10*3/uL (ref 0.0–0.1)
Basophils Relative: 0 %
Hemoglobin: 12.3 g/dL (ref 12.0–15.0)
Immature Granulocytes: 0 %
Lymphocytes Relative: 14 %
Lymphs Abs: 1.1 10*3/uL (ref 0.7–4.0)
MCH: 31.1 pg (ref 26.0–34.0)
MCV: 90.4 fL (ref 80.0–100.0)
Monocytes Absolute: 0.7 10*3/uL (ref 0.1–1.0)
Neutrophils Relative %: 76 %
Platelets: 164 10*3/uL (ref 150–400)
RBC: 3.95 MIL/uL (ref 3.87–5.11)
RDW: 13.2 % (ref 11.5–15.5)

## 2022-07-24 LAB — BASIC METABOLIC PANEL
Anion gap: 9 (ref 5–15)
BUN: 20 mg/dL (ref 8–23)
CO2: 21 mmol/L — ABNORMAL LOW (ref 22–32)
Calcium: 8.6 mg/dL — ABNORMAL LOW (ref 8.9–10.3)
Chloride: 108 mmol/L (ref 98–111)
Creatinine, Ser: 0.78 mg/dL (ref 0.44–1.00)
Glucose, Bld: 191 mg/dL — ABNORMAL HIGH (ref 70–99)
Potassium: 3.8 mmol/L (ref 3.5–5.1)

## 2022-07-24 LAB — GLUCOSE, CAPILLARY
Glucose-Capillary: 128 mg/dL — ABNORMAL HIGH (ref 70–99)
Glucose-Capillary: 154 mg/dL — ABNORMAL HIGH (ref 70–99)
Glucose-Capillary: 154 mg/dL — ABNORMAL HIGH (ref 70–99)
Glucose-Capillary: 169 mg/dL — ABNORMAL HIGH (ref 70–99)
Glucose-Capillary: 174 mg/dL — ABNORMAL HIGH (ref 70–99)

## 2022-07-24 MED ORDER — IPRATROPIUM-ALBUTEROL 0.5-2.5 (3) MG/3ML IN SOLN: 3.0000 mL | Freq: Once | RESPIRATORY_TRACT | Status: DC

## 2022-07-24 MED ORDER — ASPIRIN 325 MG PO TABS: 325.0000 mg | ORAL_TABLET | Freq: Every day | ORAL | Status: DC

## 2022-07-24 MED ORDER — ENOXAPARIN SODIUM 40 MG/0.4ML IJ SOSY: 40.0000 mg | PREFILLED_SYRINGE | INTRAMUSCULAR | Status: DC

## 2022-07-24 MED ORDER — ASPIRIN 300 MG RE SUPP: 300.0000 mg | Freq: Every day | RECTAL | Status: DC

## 2022-07-24 MED ORDER — SODIUM PHOSPHATES 45 MMOLE/15ML IV SOLN
30.0000 mmol | Freq: Once | INTRAVENOUS | Status: AC
  Administered 2022-07-24: 30 mmol via INTRAVENOUS
  Filled 2022-07-24: qty 10

## 2022-07-24 MED ORDER — MORPHINE SULFATE (PF) 2 MG/ML IV SOLN: 2.0000 mg | INTRAVENOUS | Status: DC | PRN

## 2022-07-24 MED ORDER — ONDANSETRON HCL 4 MG/2ML IJ SOLN
4.0000 mg | Freq: Four times a day (QID) | INTRAMUSCULAR | Status: DC | PRN
  Administered 2022-07-24: 4 mg via INTRAVENOUS
  Filled 2022-07-24 (×2): qty 2

## 2022-07-24 MED ORDER — INSULIN ASPART 100 UNIT/ML IJ SOLN
0.0000 [IU] | Freq: Four times a day (QID) | INTRAMUSCULAR | Status: DC
  Administered 2022-07-24 (×2): 2 [IU] via SUBCUTANEOUS

## 2022-07-25 DIAGNOSIS — I639 Cerebral infarction, unspecified: Secondary | ICD-10-CM | POA: Diagnosis not present

## 2022-07-25 MED ORDER — POLYVINYL ALCOHOL 1.4 % OP SOLN: 1.0000 [drp] | Freq: Four times a day (QID) | OPHTHALMIC | Status: DC | PRN

## 2022-07-25 MED ORDER — HALOPERIDOL 1 MG PO TABS: 0.5000 mg | ORAL_TABLET | ORAL | Status: DC | PRN

## 2022-07-25 MED ORDER — BIOTENE DRY MOUTH MT LIQD: 15.0000 mL | OROMUCOSAL | Status: DC | PRN

## 2022-07-25 MED ORDER — MORPHINE 100MG IN NS 100ML (1MG/ML) PREMIX INFUSION
0.0000 mg/h | INTRAVENOUS | Status: DC
  Administered 2022-07-25: 1 mg/h via INTRAVENOUS
  Filled 2022-07-25: qty 100

## 2022-07-25 MED ORDER — MORPHINE BOLUS VIA INFUSION: 1.0000 mg | INTRAVENOUS | Status: DC | PRN

## 2022-07-25 MED ORDER — ONDANSETRON 4 MG PO TBDP: 4.0000 mg | ORAL_TABLET | Freq: Four times a day (QID) | ORAL | Status: DC | PRN

## 2022-07-25 MED ORDER — METOPROLOL TARTRATE 5 MG/5ML IV SOLN
5.0000 mg | INTRAVENOUS | Status: AC
  Administered 2022-07-25: 5 mg via INTRAVENOUS
  Filled 2022-07-25: qty 5

## 2022-07-25 MED ORDER — HALOPERIDOL LACTATE 2 MG/ML PO CONC: 0.5000 mg | ORAL | Status: DC | PRN

## 2022-07-25 MED ORDER — HALOPERIDOL LACTATE 5 MG/ML IJ SOLN: 0.5000 mg | INTRAMUSCULAR | Status: DC | PRN

## 2022-07-25 MED ORDER — GLYCOPYRROLATE 0.2 MG/ML IJ SOLN
0.2000 mg | INTRAMUSCULAR | Status: DC | PRN
  Administered 2022-07-25: 0.2 mg via INTRAVENOUS
  Filled 2022-07-25: qty 1

## 2022-07-25 MED ORDER — ONDANSETRON HCL 4 MG/2ML IJ SOLN: 4.0000 mg | Freq: Four times a day (QID) | INTRAMUSCULAR | Status: DC | PRN

## 2022-07-26 ENCOUNTER — Encounter: Payer: Self-pay | Admitting: Internal Medicine

## 2022-08-19 DEATH — deceased

## 2022-10-19 ENCOUNTER — Ambulatory Visit: Payer: Medicare Other | Admitting: Interventional Cardiology
